# Patient Record
Sex: Male | Born: 1955 | Race: White | Hispanic: No | Marital: Married | Smoking: Never smoker
Health system: Southern US, Community
[De-identification: ages and names within clinical notes are randomized; demographics above are authoritative.]

## PROBLEM LIST (undated history)

## (undated) HISTORY — PX: FOOT FRACTURE SURGERY: SHX645

---

## 2004-09-05 ENCOUNTER — Emergency Department (HOSPITAL_COMMUNITY): Admission: EM | Admit: 2004-09-05 | Discharge: 2004-09-05 | Payer: Self-pay | Admitting: Emergency Medicine

## 2004-09-07 ENCOUNTER — Ambulatory Visit (HOSPITAL_BASED_OUTPATIENT_CLINIC_OR_DEPARTMENT_OTHER): Admission: RE | Admit: 2004-09-07 | Discharge: 2004-09-07 | Payer: Self-pay | Admitting: Orthopedic Surgery

## 2009-06-05 ENCOUNTER — Ambulatory Visit: Payer: Self-pay | Admitting: Family Medicine

## 2010-02-14 IMAGING — CR CERVICAL SPINE - 2-3 VIEW
1 series · 4 of 4 positions shown · non-contrast
Comparison: none

REASON FOR EXAM: neck pain
COMMENTS:

[Series 2: view not recorded · 0.17mm/px · 4 of 4 slices shown]
[im 1/4]
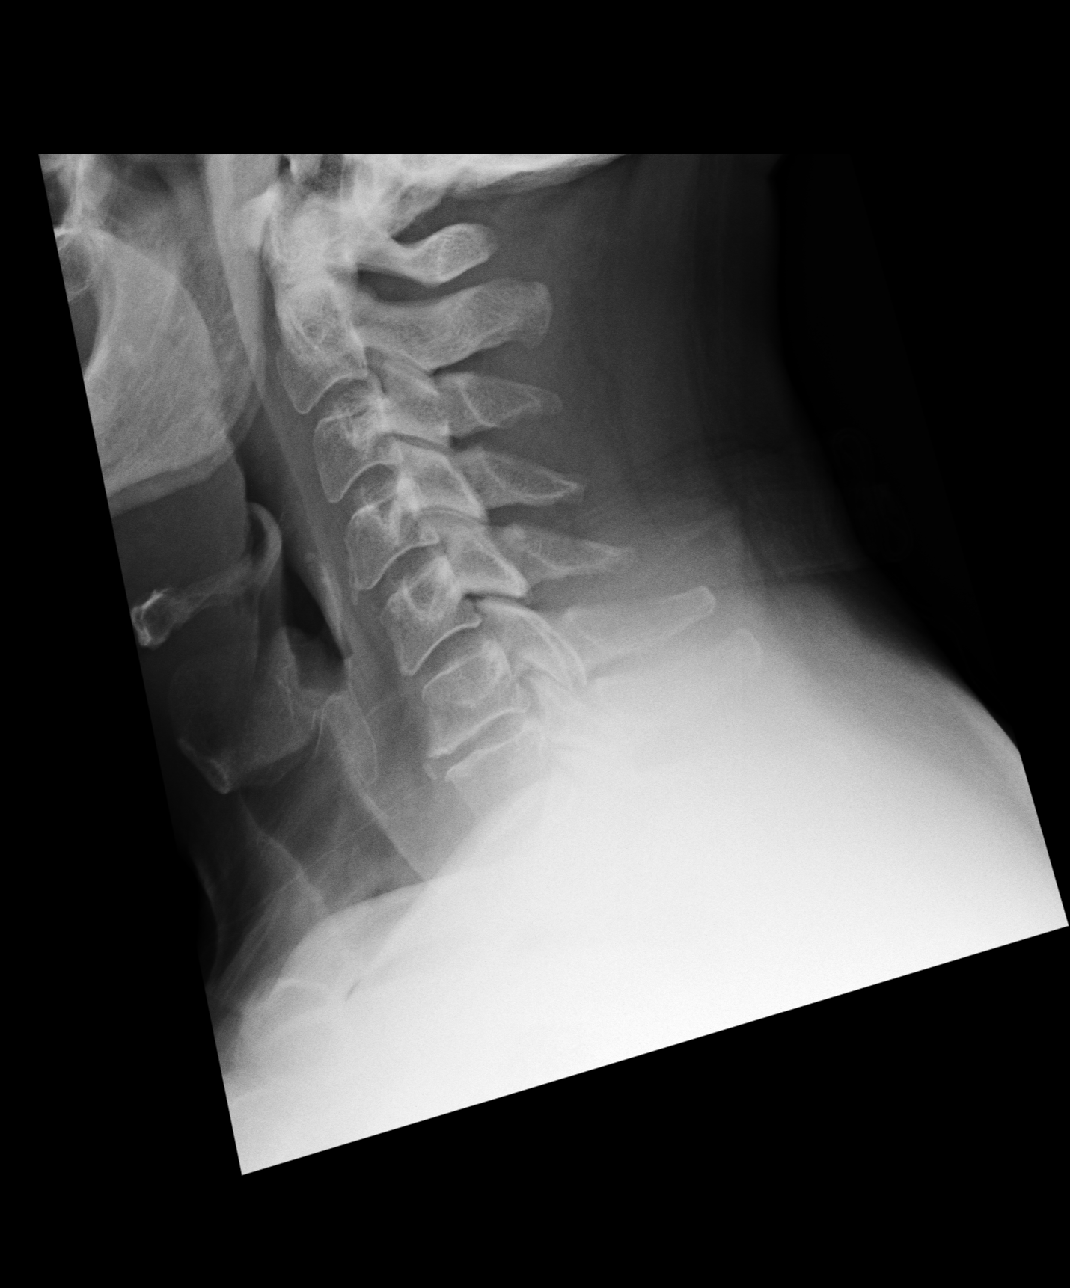
[im 2/4]
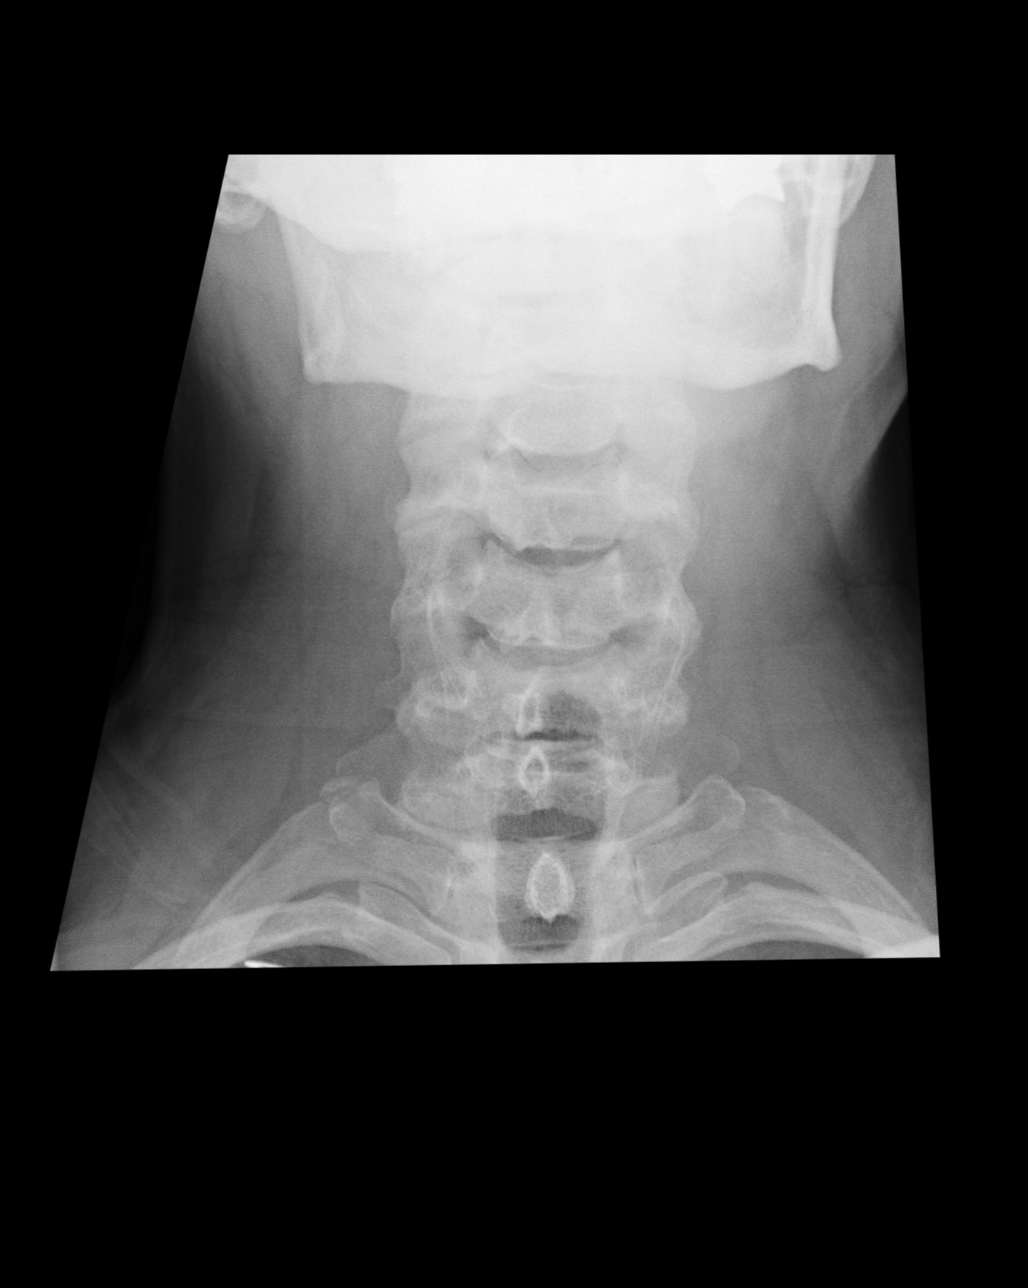
[im 3/4]
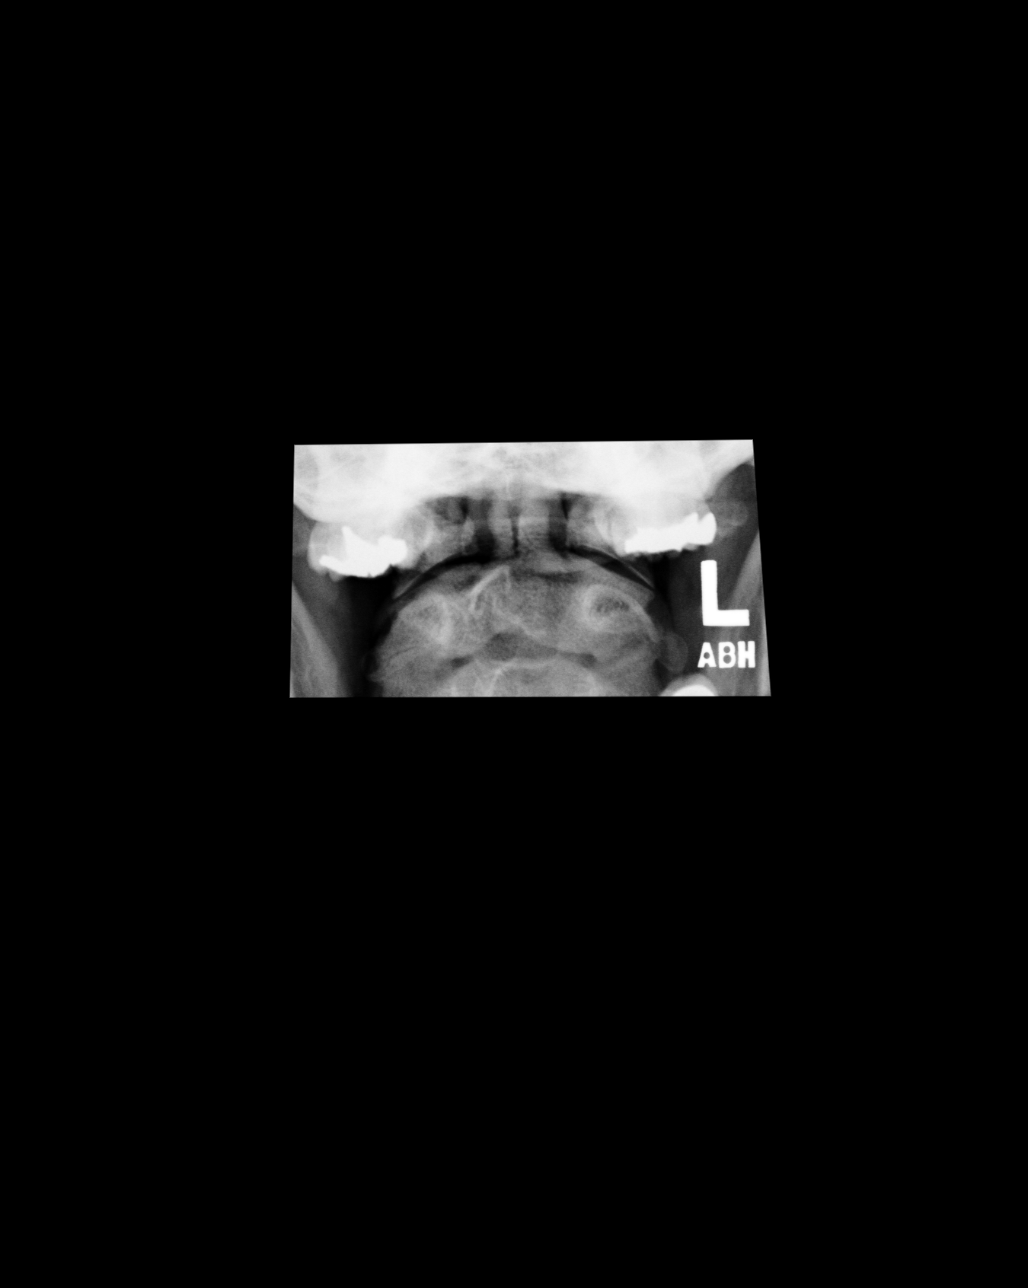
[im 4/4]
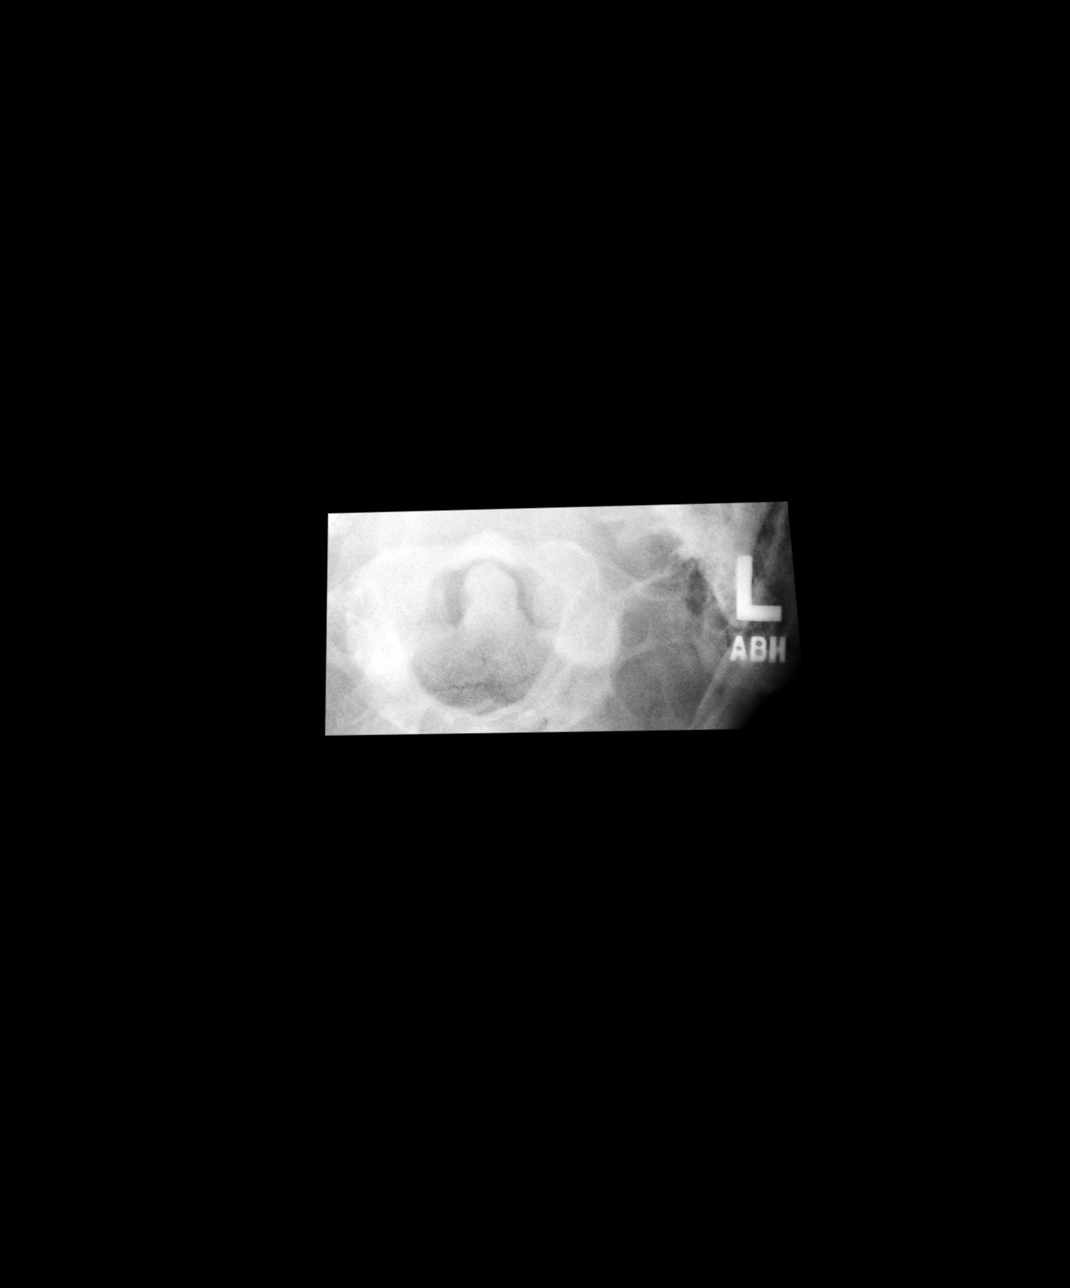

[4 of 4 positions shown; findings below may reference images not displayed]

PROCEDURE:     KDR - KDXR C-SPINE AP AND LATERAL  - June 05, 2009 [DATE]

RESULT:     The vertebral body heights are well-maintained. No fracture is
seen. There is slight narrowing of the C6-C7 cervical disc space. The change
is minimal but could represent early manifestation of disc disease. This
could be further evaluated by MR if clinically indicated. There is noted
slight anterior spur formation at the C6-C7 level. The odontoid process is
intact. No cervical rib formation is seen. In the lateral view there is
apparent straightening of the cervical spine. This finding is nonspecific
but would raise the question of cervical muscle spasm.
IMPRESSION: 1. No fracture is seen.
2. No lytic or blastic lesions are identified.
3. There is a slightly narrowed appearance to the C6-C7 cervical disc space
suspicious for early manifestation of cervical disc disease. This could be
further evaluated by MR if clinically indicated.
4. There is straightening of the cervical spine as noted above.

## 2014-07-10 ENCOUNTER — Ambulatory Visit: Payer: Self-pay | Admitting: Family Medicine

## 2014-11-28 LAB — LIPID PANEL
Cholesterol: 162 mg/dL (ref 0–200)
HDL: 26 mg/dL — AB (ref 35–70)
LDL Cholesterol: 105 mg/dL
LDl/HDL Ratio: 4
Triglycerides: 156 mg/dL (ref 40–160)

## 2014-11-28 LAB — CBC AND DIFFERENTIAL
HCT: 38 % — AB (ref 41–53)
Hemoglobin: 12.9 g/dL — AB (ref 13.5–17.5)
Neutrophils Absolute: 2 /uL
Platelets: 221 10*3/uL (ref 150–399)
WBC: 3.9 10^3/mL

## 2014-11-28 LAB — BASIC METABOLIC PANEL
BUN: 16 mg/dL (ref 4–21)
Creatinine: 0.9 mg/dL (ref 0.6–1.3)
Glucose: 99 mg/dL
Potassium: 4.2 mmol/L (ref 3.4–5.3)
Sodium: 141 mmol/L (ref 137–147)

## 2014-11-28 LAB — HEPATIC FUNCTION PANEL
ALT: 9 U/L — AB (ref 10–40)
AST: 14 U/L (ref 14–40)
Alkaline Phosphatase: 64 U/L (ref 25–125)
Bilirubin, Total: 0.3 mg/dL

## 2014-11-28 LAB — TSH: TSH: 2.58 u[IU]/mL (ref 0.41–5.90)

## 2014-11-28 LAB — PSA: PSA: 0.4

## 2015-02-19 LAB — HM COLONOSCOPY: HM Colonoscopy: NORMAL

## 2015-03-21 IMAGING — CR CERVICAL SPINE - COMPLETE 4+ VIEW
2 series · 7 of 7 positions shown · non-contrast
Comparison: 06/05/2009

CLINICAL DATA: Right neck pain without injury

EXAM:
CERVICAL SPINE  4+ VIEWS

[Series 1: kdxr c-spine complete · 0.14mm/px · 6 of 6 slices shown (1 of 2)]
[im 1/6]
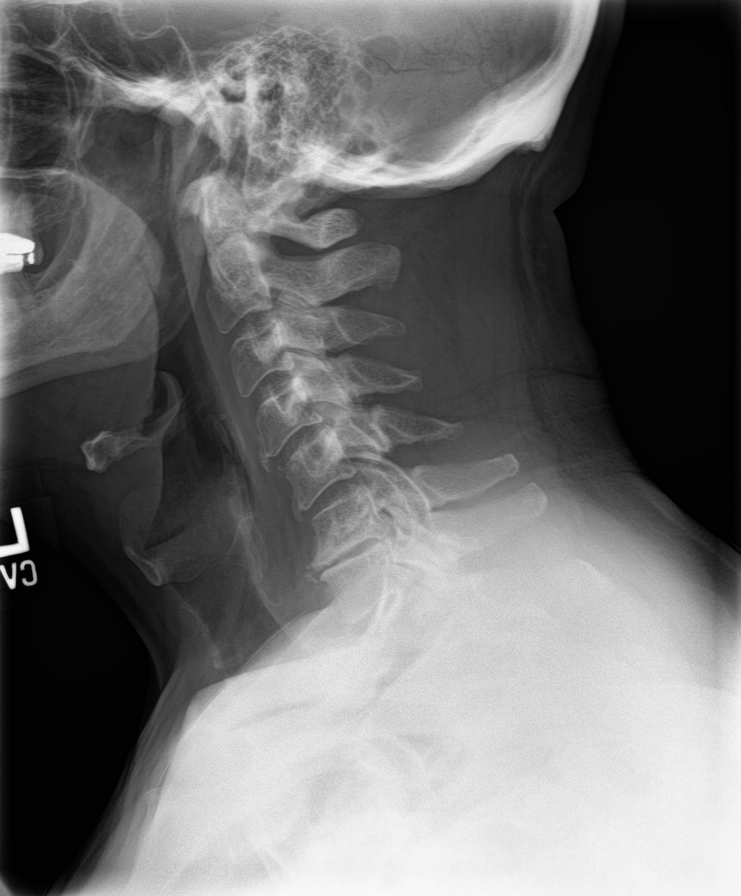
[im 2/6]
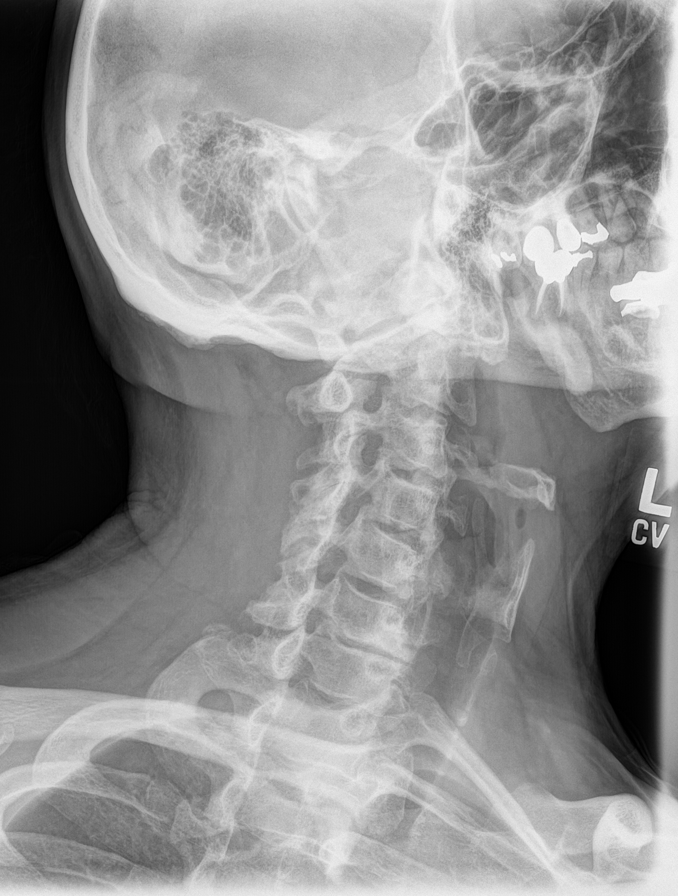
[im 3/6]
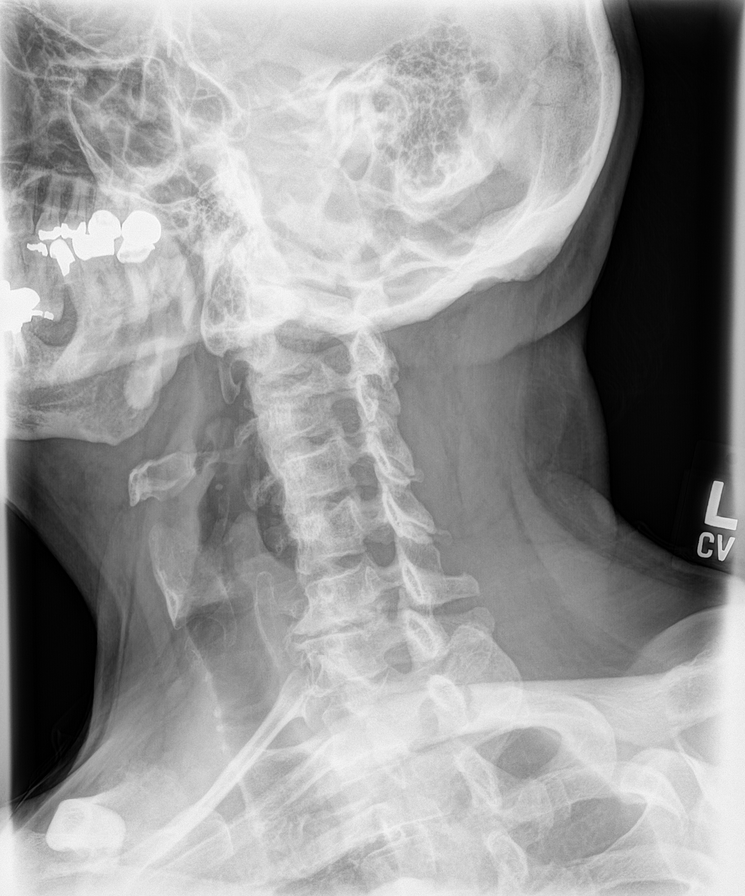
[im 4/6]
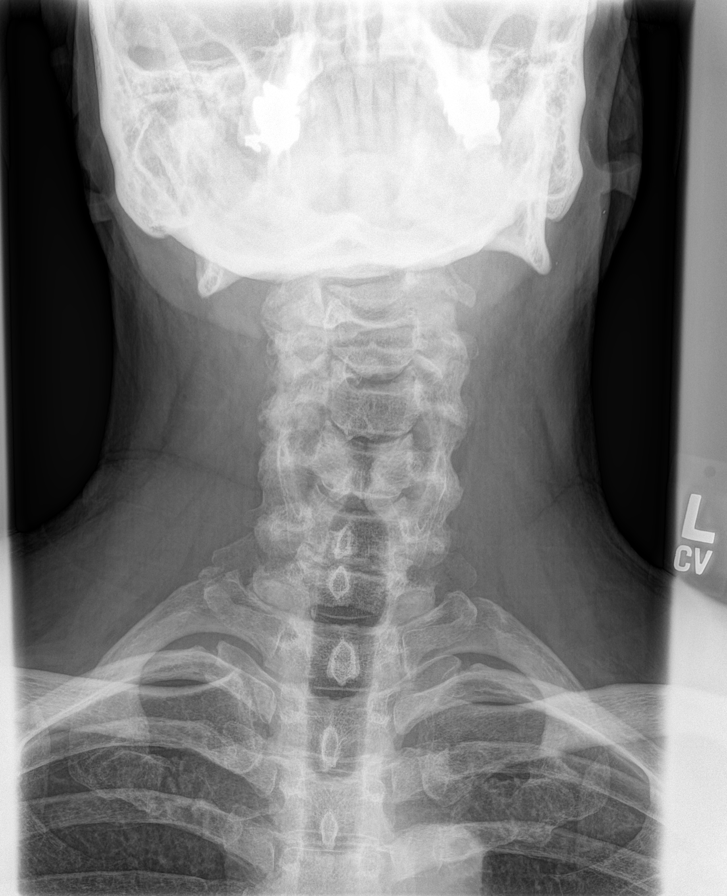
[im 5/6]
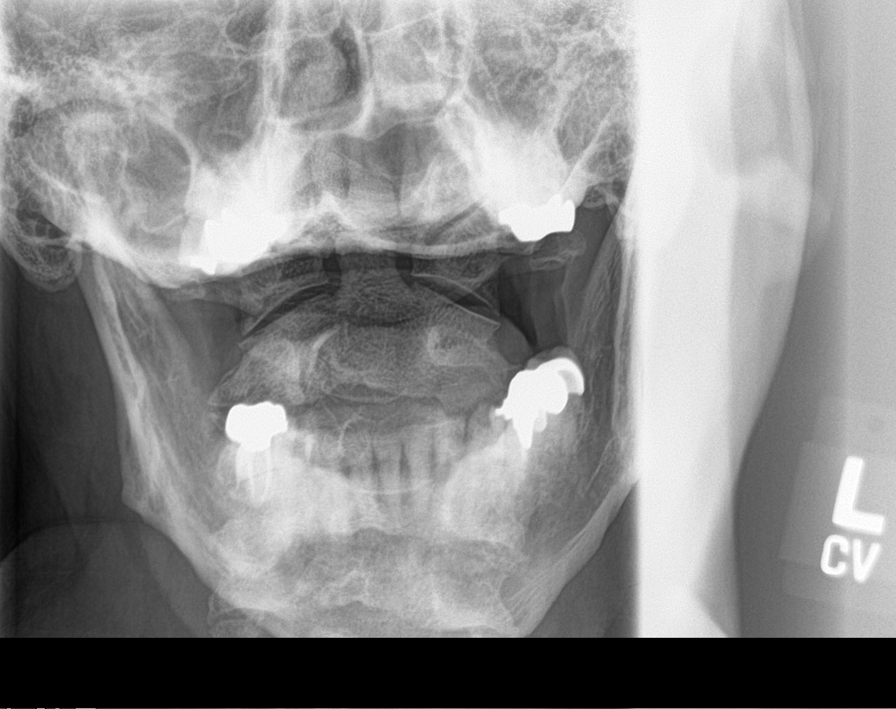
[im 6/6]
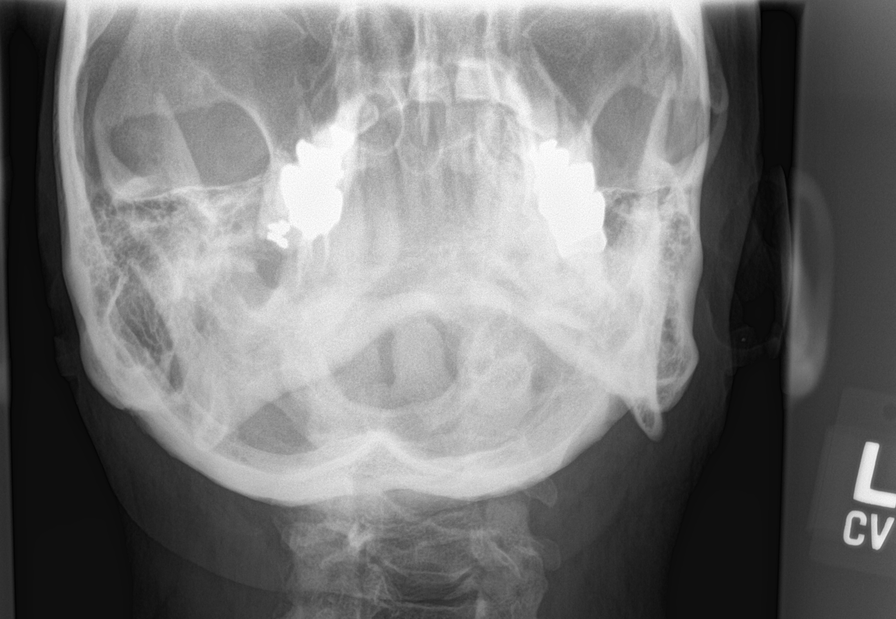

[kdxr c-spine complete (2 of 2)]
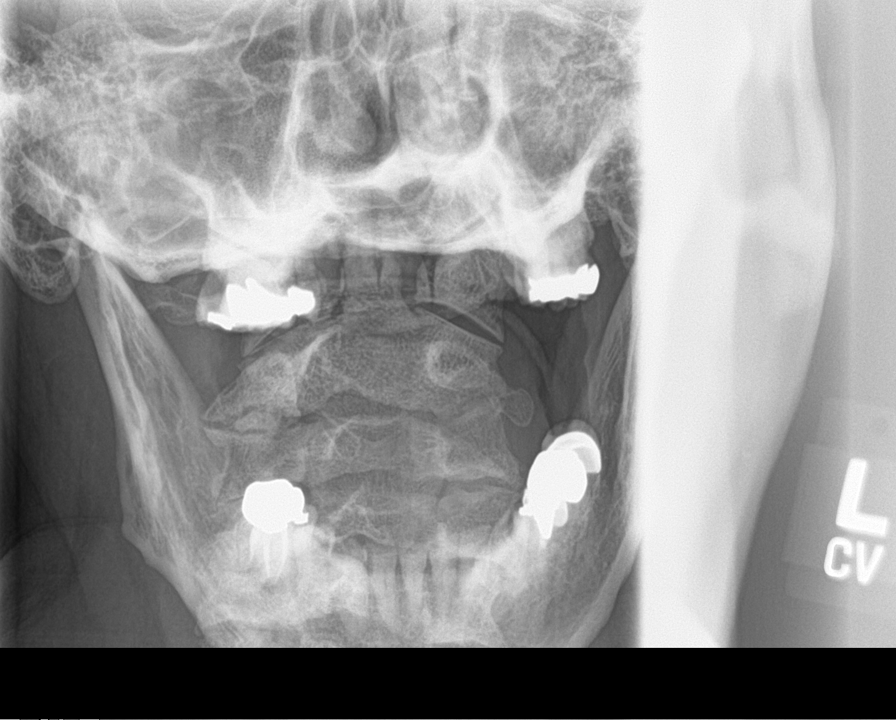

[7 of 7 positions shown; findings below may reference images not displayed]

FINDINGS: Seven cervical segments are well visualized. Disc space narrowing is
noted at C6-7 which is increased in the interval from the prior
exam. Anterior osteophytes are noted at C4-5, C5-6 and C6-7. No
significant neural foraminal narrowing is noted. No acute fracture
or acute facet abnormality is seen. The odontoid is within normal
limits.
IMPRESSION: Degenerative change without acute abnormality.

## 2015-11-04 DIAGNOSIS — G8929 Other chronic pain: Secondary | ICD-10-CM | POA: Insufficient documentation

## 2015-11-04 DIAGNOSIS — E785 Hyperlipidemia, unspecified: Secondary | ICD-10-CM | POA: Insufficient documentation

## 2015-11-04 DIAGNOSIS — M549 Dorsalgia, unspecified: Secondary | ICD-10-CM

## 2015-11-04 DIAGNOSIS — E669 Obesity, unspecified: Secondary | ICD-10-CM | POA: Insufficient documentation

## 2015-11-04 DIAGNOSIS — N529 Male erectile dysfunction, unspecified: Secondary | ICD-10-CM | POA: Insufficient documentation

## 2015-12-02 ENCOUNTER — Ambulatory Visit (INDEPENDENT_AMBULATORY_CARE_PROVIDER_SITE_OTHER): Payer: 59 | Admitting: Family Medicine

## 2015-12-02 ENCOUNTER — Encounter: Payer: Self-pay | Admitting: Family Medicine

## 2015-12-02 VITALS — BP 160/98 | HR 68 | Temp 98.6°F | Resp 16 | Ht 70.0 in | Wt 234.0 lb

## 2015-12-02 DIAGNOSIS — Z0000 Encounter for general adult medical examination without abnormal findings: Secondary | ICD-10-CM

## 2015-12-02 DIAGNOSIS — M722 Plantar fascial fibromatosis: Secondary | ICD-10-CM | POA: Diagnosis not present

## 2015-12-02 DIAGNOSIS — Z125 Encounter for screening for malignant neoplasm of prostate: Secondary | ICD-10-CM

## 2015-12-02 LAB — POCT URINALYSIS DIPSTICK
Bilirubin, UA: NEGATIVE
Blood, UA: NEGATIVE
Glucose, UA: NEGATIVE
Ketones, UA: NEGATIVE
Leukocytes, UA: NEGATIVE
Nitrite, UA: NEGATIVE
Protein, UA: NEGATIVE
Spec Grav, UA: 1.02
Urobilinogen, UA: NEGATIVE
pH, UA: 5

## 2015-12-02 MED ORDER — NAPROXEN 500 MG PO TABS: 500.0000 mg | ORAL_TABLET | Freq: Two times a day (BID) | ORAL | Status: DC

## 2015-12-02 NOTE — Progress Notes (Signed)
Patient ID: Travis Brown, male   DOB: 1956/06/24, 60 y.o.   MRN: [id number] Patient: Travis Brown, Male    DOB: [birth year], 60 y.o.   MRN: [id number] Visit Date: 12/02/2015  Today's Provider: Megan Mans, MD   Chief Complaint  Patient presents with  . Annual Exam   Subjective:  Travis Brown is a 60 y.o. male who presents today for health maintenance and complete physical. He feels well. He reports exercising none. He reports he is sleeping well.   Review of Systems  Constitutional: Negative.   HENT: Positive for tinnitus.   Eyes: Negative.   Respiratory: Negative.   Cardiovascular: Negative.   Gastrointestinal: Positive for anal bleeding.  Endocrine: Negative.   Genitourinary: Negative.   Musculoskeletal: Negative.   Skin: Negative.   Allergic/Immunologic: Negative.   Neurological: Negative.   Hematological: Negative.   Psychiatric/Behavioral: Negative.     Social History   Social History  . Marital Status: Married    Spouse Name: N/A  . Number of Children: N/A  . Years of Education: N/A   Occupational History  . Not on file.   Social History Main Topics  . Smoking status: Never Smoker   . Smokeless tobacco: Not on file  . Alcohol Use: 0.0 oz/week    0 Standard drinks or equivalent per week     Comment: 4 per week  . Drug Use: No  . Sexual Activity: Not on file   Other Topics Concern  . Not on file   Social History Narrative    Patient Active Problem List   Diagnosis Date Noted  . Back pain, chronic 11/04/2015  . 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 HLD (hyperlipidemia) 11/04/2015  . Adiposity 11/04/2015    Past Surgical History  Procedure Laterality Date  . Foot fracture surgery      pins put in    His family history includes Dementia in his mother; Diabetes in his father; Heart disease in his brother and father; Hypertension in his brother and father; Kidney failure in his father.    Outpatient Prescriptions  Prior to Visit  Medication Sig Dispense Refill  . naproxen (NAPROSYN) 500 MG tablet Take by mouth.     No facility-administered medications prior to visit.   Outpatient Encounter Prescriptions as of 12/02/2015  Medication Sig Note  . [DISCONTINUED] naproxen (NAPROSYN) 500 MG tablet Take by mouth. 11/04/2015: Received from: [facility]   No facility-administered encounter medications on file as of 12/02/2015.    Patient Care Team: Maple Hudson., MD as PCP - General (Family Medicine)     Objective:   Vitals:  Filed Vitals:   12/02/15 0827  BP: 160/98  Pulse: 68  Temp: 98.6 F (37 C)  TempSrc: Oral  Resp: 16  Height:  (1.778 m)  Weight: 234 lb (106.142 kg)    Physical Exam  Constitutional: He is oriented to person, place, and time. He appears well-developed and well-nourished.  HENT:  Head: Normocephalic and atraumatic.  Right Ear: External ear normal.  Left Ear: External ear normal.  Nose: Nose normal.  Mouth/Throat: Oropharynx is clear and moist.  Eyes: Conjunctivae and EOM are normal. Pupils are equal, round, and reactive to light.  Neck: Normal range of motion. Neck supple.  Cardiovascular: Normal rate, regular rhythm, normal heart sounds and intact distal pulses.   Pulmonary/Chest: Effort normal and breath sounds normal.  Abdominal: Soft. Bowel sounds are normal.  Genitourinary: Penis normal.  Musculoskeletal: Normal range of motion.  Neurological: He is alert and oriented to person, place, and time.  Skin: Skin is warm and dry.  Psychiatric: He has a normal mood and affect. His behavior is normal. Judgment and thought content normal.     Depression Screen No flowsheet data found.    Assessment & Plan:    1. Annual physical exam  - CBC With Differential/Platelet - COMPLETE METABOLIC PANEL WITH GFR - Lipid Panel With LDL/HDL Ratio - TSH  2. Prostate cancer screening  - PSA   Exercise Activities and Dietary  recommendations Goals    None      Immunization History  Administered Date(s) Administered  . Tdap 07/24/2008    Health Maintenance  Topic Date Due  . Hepatitis C Screening  03-Oct-1956  . HIV Screening  04/12/1970  . ZOSTAVAX  04/13/2015  . INFLUENZA VACCINE  06/09/2015  . TETANUS/TDAP  07/24/2018  . COLONOSCOPY  02/18/2025      Discussed health benefits of physical activity, and encouraged him to engage in regular exercise appropriate for his age and condition.   right plPlantar fasciitis Treatment  with naproxen twice a day for a few weeks then refer to podiatry if it  is not improved. ------------------------------------------------------------------------------------------------------------

## 2015-12-03 ENCOUNTER — Telehealth: Payer: Self-pay

## 2015-12-03 LAB — COMPREHENSIVE METABOLIC PANEL
ALT: 9 IU/L (ref 0–44)
AST: 15 IU/L (ref 0–40)
Albumin/Globulin Ratio: 1.6 (ref 1.1–2.5)
Albumin: 4.4 g/dL (ref 3.6–4.8)
Alkaline Phosphatase: 68 IU/L (ref 39–117)
BUN/Creatinine Ratio: 17 (ref 10–22)
BUN: 15 mg/dL (ref 8–27)
Bilirubin Total: 0.3 mg/dL (ref 0.0–1.2)
CO2: 25 mmol/L (ref 18–29)
Calcium: 9 mg/dL (ref 8.6–10.2)
Chloride: 102 mmol/L (ref 96–106)
Creatinine, Ser: 0.87 mg/dL (ref 0.76–1.27)
GFR calc Af Amer: 108 mL/min/{1.73_m2} (ref >59)
GFR calc non Af Amer: 94 mL/min/{1.73_m2} (ref >59)
Globulin, Total: 2.7 g/dL (ref 1.5–4.5)
Glucose: 108 mg/dL — ABNORMAL HIGH (ref 65–99)
Potassium: 4.5 mmol/L (ref 3.5–5.2)
Sodium: 143 mmol/L (ref 134–144)
Total Protein: 7.1 g/dL (ref 6.0–8.5)

## 2015-12-03 LAB — CBC WITH DIFFERENTIAL/PLATELET
Basophils Absolute: 0 10*3/uL (ref 0.0–0.2)
Basos: 0 %
EOS (ABSOLUTE): 0.1 10*3/uL (ref 0.0–0.4)
Eos: 2 %
Hematocrit: 38.3 % (ref 37.5–51.0)
Hemoglobin: 12.8 g/dL (ref 12.6–17.7)
Immature Grans (Abs): 0 10*3/uL (ref 0.0–0.1)
Immature Granulocytes: 0 %
Lymphocytes Absolute: 1.6 10*3/uL (ref 0.7–3.1)
Lymphs: 30 %
MCH: 26.5 pg — ABNORMAL LOW (ref 26.6–33.0)
MCHC: 33.4 g/dL (ref 31.5–35.7)
MCV: 79 fL (ref 79–97)
Monocytes Absolute: 0.4 10*3/uL (ref 0.1–0.9)
Monocytes: 7 %
Neutrophils Absolute: 3.3 10*3/uL (ref 1.4–7.0)
Neutrophils: 61 %
Platelets: 206 10*3/uL (ref 150–379)
RBC: 4.83 x10E6/uL (ref 4.14–5.80)
RDW: 14.3 % (ref 12.3–15.4)
WBC: 5.4 10*3/uL (ref 3.4–10.8)

## 2015-12-03 LAB — LIPID PANEL WITH LDL/HDL RATIO
Cholesterol, Total: 180 mg/dL (ref 100–199)
HDL: 30 mg/dL — ABNORMAL LOW (ref >39)
LDL Calculated: 119 mg/dL — ABNORMAL HIGH (ref 0–99)
LDl/HDL Ratio: 4 ratio units — ABNORMAL HIGH (ref 0.0–3.6)
Triglycerides: 153 mg/dL — ABNORMAL HIGH (ref 0–149)
VLDL Cholesterol Cal: 31 mg/dL (ref 5–40)

## 2015-12-03 LAB — PSA: Prostate Specific Ag, Serum: 0.3 ng/mL (ref 0.0–4.0)

## 2015-12-03 LAB — TSH: TSH: 3.27 u[IU]/mL (ref 0.450–4.500)

## 2015-12-03 NOTE — Telephone Encounter (Signed)
-----   Message from Travis Brown., MD sent at 12/03/2015 11:02 AM EST ----- Labs okay. Patient mildly prediabetic. Diet and exercise suggested.

## 2015-12-03 NOTE — Telephone Encounter (Signed)
LMTCB 12/03/2015  Thanks,   -Laura  

## 2015-12-03 NOTE — Telephone Encounter (Signed)
Pt advised.   Thanks,   -Laura  

## 2016-03-04 ENCOUNTER — Encounter: Payer: Self-pay | Admitting: Family Medicine

## 2016-03-04 ENCOUNTER — Ambulatory Visit (INDEPENDENT_AMBULATORY_CARE_PROVIDER_SITE_OTHER): Payer: 59 | Admitting: Family Medicine

## 2016-03-04 VITALS — BP 142/90 | HR 62 | Temp 98.8°F | Resp 12 | Wt 225.0 lb

## 2016-03-04 DIAGNOSIS — IMO0001 Reserved for inherently not codable concepts without codable children: Secondary | ICD-10-CM

## 2016-03-04 DIAGNOSIS — R030 Elevated blood-pressure reading, without diagnosis of hypertension: Secondary | ICD-10-CM | POA: Diagnosis not present

## 2016-03-04 DIAGNOSIS — R7309 Other abnormal glucose: Secondary | ICD-10-CM | POA: Diagnosis not present

## 2016-03-04 DIAGNOSIS — E669 Obesity, unspecified: Secondary | ICD-10-CM

## 2016-03-04 DIAGNOSIS — M722 Plantar fascial fibromatosis: Secondary | ICD-10-CM | POA: Diagnosis not present

## 2016-03-04 LAB — POCT GLYCOSYLATED HEMOGLOBIN (HGB A1C): Hemoglobin A1C: 5.4

## 2016-03-04 NOTE — Progress Notes (Signed)
Patient ID: Travis Brown, male   DOB: Mar 23, 1956, 60 y.o.   MRN: [id number]    Subjective:  HPI  Patient is here for 3 months follow up.  B/P was elevated last time per patient more than usual and we were going to follow up on that. Patient is not checking his b/p at home. BP Readings from Last 3 Encounters:  03/04/16 142/90  12/02/15 160/98  11/28/14 138/88   Hyperglycemia: Patient had routine labs done in January and glucose was 108-fasting. Patient does not check his sugar at home. He has been working on his habits and lost 9 lbs since his last visit. Wt Readings from Last 3 Encounters:  03/04/16 225 lb (102.059 kg)  12/02/15 234 lb (106.142 kg)  11/28/14 231 lb (104.781 kg)    Plantar fascitis: patient was started on Naproxen and he took it for about 2 weeks and has stopped, symptoms have improved.  Prior to Admission medications   Medication Sig Start Date End Date Taking? Authorizing Provider  naproxen (NAPROSYN) 500 MG tablet Take 1 tablet (500 mg total) by mouth 2 (two) times daily with a meal. 12/02/15   Maple Hudson., MD  naproxen (NAPROSYN) 500 MG tablet Take 1 tablet (500 mg total) by mouth 2 (two) times daily with a meal. 12/02/15   Maple Hudson., MD    Patient Active Problem List   Diagnosis Date Noted  . Back pain, chronic 11/04/2015  . 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 HLD (hyperlipidemia) 11/04/2015  . Adiposity 11/04/2015    No past medical history on file.  Social History   Social History  . Marital Status: Married    Spouse Name: N/A  . Number of Children: N/A  . Years of Education: N/A   Occupational History  . Not on file.   Social History Main Topics  . Smoking status: Never Smoker   . Smokeless tobacco: Never Used  . Alcohol Use: 0.0 oz/week    0 Standard drinks or equivalent per week     Comment: 4 per week  . Drug Use: No  . Sexual Activity: Not on file   Other Topics Concern  . Not on file     Social History Narrative    No Known Allergies  Review of Systems  Constitutional: Negative.   Respiratory: Negative.   Cardiovascular: Negative.   Gastrointestinal: Negative.   Musculoskeletal: Negative.     Immunization History  Administered Date(s) Administered  . Tdap 07/24/2008   Objective:  BP 142/90 mmHg  Pulse 62  Temp(Src) 98.8 F (37.1 C)  Resp 12  Wt 225 lb (102.059 kg)  Physical Exam  Constitutional: He is oriented to person, place, and time and well-developed, well-nourished, and in no distress.  HENT:  Head: Normocephalic and atraumatic.  Right Ear: External ear normal.  Left Ear: External ear normal.  Eyes: Conjunctivae are normal. Pupils are equal, round, and reactive to light.  Neck: Normal range of motion. Neck supple.  Cardiovascular: Normal rate, regular rhythm, normal heart sounds and intact distal pulses.   No murmur heard. Pulmonary/Chest: Effort normal and breath sounds normal. No respiratory distress. He has no wheezes.  Musculoskeletal: He exhibits no edema or tenderness.  Neurological: He is alert and oriented to person, place, and time.  Psychiatric: Mood, memory, affect and judgment normal.    Lab Results  Component Value Date   WBC 5.4 12/02/2015   HGB 12.9* 11/28/2014   HCT 38.3  12/02/2015   PLT 206 12/02/2015   GLUCOSE 108* 12/02/2015   CHOL 180 12/02/2015   TRIG 153* 12/02/2015   HDL 30* 12/02/2015   LDLCALC 119* 12/02/2015   TSH 3.270 12/02/2015   PSA 0.4 11/28/2014    CMP     Component Value Date/Time   NA 143 12/02/2015 0942   K 4.5 12/02/2015 0942   CL 102 12/02/2015 0942   CO2 25 12/02/2015 0942   GLUCOSE 108* 12/02/2015 0942   BUN 15 12/02/2015 0942   CREATININE 0.87 12/02/2015 0942   CREATININE 0.9 11/28/2014   CALCIUM 9.0 12/02/2015 0942   PROT 7.1 12/02/2015 0942   ALBUMIN 4.4 12/02/2015 0942   AST 15 12/02/2015 0942   ALT 9 12/02/2015 0942   ALKPHOS 68 12/02/2015 0942   BILITOT 0.3 12/02/2015 0942    GFRNONAA 94 12/02/2015 0942   GFRAA 108 12/02/2015 0942    Assessment and Plan :  1. Elevated blood pressure Better today, still borderline. Continue working on habits. Follow. May need to add medication in the future but not today.  2. Elevated glucose level A1C 5.4 today. Good. Patient has worked on his habits. Follow. - POCT HgB A1C  3. Plantar fascia syndrome Improved. Follow as needed.  4. Adiposity Continue working on habits, discussed this in details. Control and weight is stressed as the key to controlling the above problems. Medications will be used if necessary. I am very pleased that he has lost 9 pounds since his last visit. Return to clinic in 6 months or sooner as needed. Patient was seen and examined by Dr. Bosie Closichard L Gilbert and note was scribed by Samara DeistAnastasiya Aleksandrova, RMA.  Julieanne Mansonichard Gilbert MD [facility] 03/04/2016 8:20 AM

## 2016-06-10 HISTORY — PX: MOUTH SURGERY: SHX715

## 2016-06-16 ENCOUNTER — Encounter: Payer: Self-pay | Admitting: Family Medicine

## 2016-06-16 ENCOUNTER — Ambulatory Visit (INDEPENDENT_AMBULATORY_CARE_PROVIDER_SITE_OTHER): Payer: 59 | Admitting: Family Medicine

## 2016-06-16 VITALS — BP 136/86 | HR 72 | Temp 97.7°F | Resp 16 | Ht 70.0 in | Wt 235.0 lb

## 2016-06-16 DIAGNOSIS — G510 Bell's palsy: Secondary | ICD-10-CM

## 2016-06-16 MED ORDER — VALACYCLOVIR HCL 1 G PO TABS: 1000.0000 mg | ORAL_TABLET | Freq: Three times a day (TID) | ORAL | 0 refills | Status: AC

## 2016-06-16 MED ORDER — PREDNISONE 10 MG PO TABS: ORAL_TABLET | ORAL | 0 refills | Status: AC

## 2016-06-16 NOTE — Patient Instructions (Signed)
Bell Palsy °Bell palsy is a condition in which the muscles on one side of the face become paralyzed. This often causes one side of the face to droop. It is a common condition and most people recover completely. °RISK FACTORS °Risk factors for Bell palsy include: °· Pregnancy. °· Diabetes. °· An infection by a virus, such as infections that cause cold sores. °CAUSES  °Bell palsy is caused by damage to or inflammation of a nerve in your face. It is unclear why this happens, but an infection by a virus may lead to it. Most of the time the reason it happens is unknown. °SIGNS AND SYMPTOMS  °Symptoms can range from mild to severe and can take place over a number of hours. Symptoms may include: °· Being unable to: °¨ Raise one or both eyebrows. °¨ Close one or both eyes. °¨ Feel parts of your face (facial numbness). °· Drooping of the eyelid and corner of the mouth. °· Weakness in the face. °· Paralysis of half your face. °· Loss of taste. °· Sensitivity to loud noises. °· Difficulty chewing. °· Tearing up of the affected eye. °· Dryness in the affected eye. °· Drooling. °· Pain behind one ear. °DIAGNOSIS  °Diagnosis of Bell palsy may include: °· A medical history and physical exam. °· An MRI. °· A CT scan. °· Electromyography (EMG). This is a test that checks how your nerves are working. °TREATMENT  °Treatment may include antiviral medicine to help shorten the length of the condition. Sometimes treatment is not needed and the symptoms go away on their own. °HOME CARE INSTRUCTIONS  °· Take medicines only as directed by your health care provider. °· Do facial massages and exercises as directed by your health care provider. °· If your eye is affected: °¨ Use moisturizing eye drops to prevent drying of your eye as directed by your health care provider. °¨ Protect your eye as directed by your health care provider. °SEEK MEDICAL CARE IF: °· Your symptoms do not get better or get worse. °· You are drooling. °· Your eye is red,  irritated, or hurts. °SEEK IMMEDIATE MEDICAL CARE IF:  °· Another part of your body feels weak or numb. °· You have difficulty swallowing. °· You have a fever along with symptoms of Bell palsy. °· You develop neck pain. °MAKE SURE YOU:  °· Understand these instructions. °· Will watch your condition. °· Will get help right away if you are not doing well or get worse. °  °This information is not intended to replace advice given to you by your health care provider. Make sure you discuss any questions you have with your health care provider. °  °Document Released: 10/25/2005 Document Revised: 07/16/2015 Document Reviewed: 02/01/2014 °Elsevier Interactive Patient Education ©2016 Else[employer] Inc[employer] ° °

## 2016-06-16 NOTE — Progress Notes (Signed)
       Patient: Travis Brown Male    DOB: 03/20/1956   60 y.o.   MRN: [id number] Visit Date: 06/16/2016  Today's Provider: Mila Merryonald Lakeesha Fontanilla, MD   Chief Complaint  Patient presents with  . Facial Droop   Subjective:    HPI Patient reports gradual onset of right side facial drooping last night. Patient reports tongue feels numb, patient reports that he had dental work on 06/10/2016 and is currently taking Amoxicillin TID for dental infection. Patient denies problems swallowing, speaking, numbness or weakness in extremities. Otherwise he feels perfectly well.     No Known Allergies Current Meds  Medication Sig  . amoxicillin (AMOXIL) 500 MG capsule Take 1 capsule by mouth 3 (three) times daily.  . Hydrocodone-Acetaminophen 5-300 MG TABS Take 1 tablet by mouth as needed.    Review of Systems  Constitutional: Negative.   Neurological: Positive for facial asymmetry.    Social History  Substance Use Topics  . Smoking status: Never Smoker  . Smokeless tobacco: Never Used  . Alcohol use 0.0 oz/week     Comment: 4 per week   Objective:   BP 136/86 (BP Location: Left Arm, Patient Position: Sitting, Cuff Size: Large)   Pulse 72   Temp 97.7 F (36.5 C) (Oral)   Resp 16   Ht 5\' 10"  (1.778 m)   Wt 235 lb (106.6 kg)   SpO2 97%   BMI 33.72 kg/m   Physical Exam   General Appearance:    Alert, cooperative, no distress  Eyes:    PERRL, conjunctiva/corneas clear, EOM's intact       Lungs:     Clear to auscultation bilaterally, respirations unlabored  Heart:    Regular rate and rhythm  Neurologic:   Awake, alert, oriented x 3. Absence of voluntary movements of entire right side of face. Absence of forehead wrinkles on right. Normal speech. Normal thought processes. Normal strength and tone of extremities.           Assessment & Plan:     1. Right-sided Bell's palsy  - valACYclovir (VALTREX) 1000 MG tablet; Take 1 tablet (1,000 mg total) by mouth every 8 (eight) hours.   Dispense: 21 tablet; Refill: 0 - predniSONE (DELTASONE) 10 MG tablet; 6 tablets for 1 day, then 5 for 1 day, then 4 for 1 day, then 3 for 1 day, then 2 for 1 day then 1 for 1 day.  Dispense: 21 tablet; Refill: 0       Mila Merryonald Megin Consalvo, MD  [facility]

## 2016-08-30 ENCOUNTER — Encounter: Payer: Self-pay | Admitting: Family Medicine

## 2016-08-30 ENCOUNTER — Ambulatory Visit (INDEPENDENT_AMBULATORY_CARE_PROVIDER_SITE_OTHER): Payer: 59 | Admitting: Family Medicine

## 2016-08-30 VITALS — BP 162/94 | HR 62 | Temp 98.6°F | Resp 12 | Wt 234.0 lb

## 2016-08-30 DIAGNOSIS — Z23 Encounter for immunization: Secondary | ICD-10-CM

## 2016-08-30 DIAGNOSIS — I10 Essential (primary) hypertension: Secondary | ICD-10-CM

## 2016-08-30 DIAGNOSIS — E7849 Other hyperlipidemia: Secondary | ICD-10-CM

## 2016-08-30 DIAGNOSIS — Z6833 Body mass index (BMI) 33.0-33.9, adult: Secondary | ICD-10-CM | POA: Diagnosis not present

## 2016-08-30 DIAGNOSIS — E784 Other hyperlipidemia: Secondary | ICD-10-CM | POA: Diagnosis not present

## 2016-08-30 MED ORDER — LISINOPRIL 10 MG PO TABS: 10.0000 mg | ORAL_TABLET | Freq: Every day | ORAL | 5 refills | Status: DC

## 2016-08-30 NOTE — Progress Notes (Signed)
Travis Brown  MRN: [id number] DOB: 08-23-1956  Subjective:  HPI  Patient is here for follow up on blood pressure. In April his b/p was better and we were going to just follow it. Patient checks his b/p sometimes and the last 2 readings were elevated. He is trying to work on increasing exercising. No cardiac symptoms present. Pt snores some but no [place] apnea per pt. Last labs were routine in January 2017. BP Readings from Last 3 Encounters:  08/30/16 (!) 162/94  06/16/16 136/86  03/04/16 (!) 142/90   Patient Active Problem List   Diagnosis Date Noted  . Back pain, chronic 11/04/2015  . 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 HLD (hyperlipidemia) 11/04/2015  . Adiposity 11/04/2015    No past medical history on file.  Social History   Social History  . Marital status: Married    Spouse name: N/A  . Number of children: N/A  . Years of education: N/A   Occupational History  . Not on file.   Social History Main Topics  . Smoking status: Never Smoker  . Smokeless tobacco: Never Used  . Alcohol use 0.0 oz/week     Comment: 4 per week  . Drug use: No  . Sexual activity: Not on file   Other Topics Concern  . Not on file   Social History Narrative  . No narrative on file    Outpatient Encounter Prescriptions as of 08/30/2016  Medication Sig Note  . [DISCONTINUED] amoxicillin (AMOXIL) 500 MG capsule Take 1 capsule by mouth 3 (three) times daily. 06/16/2016: Received from: External Pharmacy  . [DISCONTINUED] Hydrocodone-Acetaminophen 5-300 MG TABS Take 1 tablet by mouth as needed. 06/16/2016: Received from: External Pharmacy   No facility-administered encounter medications on file as of 08/30/2016.     No Known Allergies  Review of Systems  Constitutional: Negative.   Respiratory: Negative.   Cardiovascular: Negative.   Gastrointestinal: Negative.   Musculoskeletal: Negative.        Some cramping off and on on the right side of the neck.    Neurological:       Residual very mild right facial weakness from recent Bell's palsy.  Psychiatric/Behavioral: Negative.    Objective:  BP (!) 162/94   Pulse 62   Temp 98.6 F (37 C)   Resp 12   Wt 234 lb (106.1 kg)   BMI 33.58 kg/m   Physical Exam  Constitutional: He is oriented to person, place, and time and well-developed, well-nourished, and in no distress.  HENT:  Head: Normocephalic and atraumatic.  Eyes: Conjunctivae are normal. Pupils are equal, round, and reactive to light.  Neck: Normal range of motion. Neck supple.  Cardiovascular: Normal rate, regular rhythm, normal heart sounds and intact distal pulses.   No murmur heard. Pulmonary/Chest: Effort normal and breath sounds normal. No respiratory distress. He has no wheezes.  Musculoskeletal: Normal range of motion. He exhibits no edema or tenderness.  Neurological: He is alert and oriented to person, place, and time. A cranial nerve deficit is present. Gait normal.  There is a mild right facial weakness with smile.  Skin: Skin is warm and dry.  Psychiatric: Mood, memory, affect and judgment normal.    Assessment and Plan :  1. Essential hypertension Elevated. Start Lisinopril 10mg . Advised of possible side effect from this medication.  2. BMI 33.0-33.9,adult Work on habits.  3. Other hyperlipidemia   HPI, Exam and A&P transcribed under direction and in the presence of  Julieanne Mansonichard Gilbert, MD. I have done the exam and reviewed the chart and it is accurate to the best of my knowledge. Julieanne Mansonichard Gilbert M.D. [facility]

## 2016-10-20 ENCOUNTER — Ambulatory Visit (INDEPENDENT_AMBULATORY_CARE_PROVIDER_SITE_OTHER): Payer: 59 | Admitting: Family Medicine

## 2016-10-20 ENCOUNTER — Encounter: Payer: Self-pay | Admitting: Family Medicine

## 2016-10-20 VITALS — BP 142/92 | HR 60 | Temp 98.4°F | Resp 14 | Wt 232.4 lb

## 2016-10-20 DIAGNOSIS — I10 Essential (primary) hypertension: Secondary | ICD-10-CM | POA: Diagnosis not present

## 2016-10-20 MED ORDER — LISINOPRIL 40 MG PO TABS: 40.0000 mg | ORAL_TABLET | Freq: Every day | ORAL | 11 refills | Status: DC

## 2016-10-20 NOTE — Progress Notes (Signed)
Patient: Travis ProRobert K Brown Male    DOB: 06/14/1956   60 y.o.   MRN: [id number] Visit Date: 10/20/2016  Today's Provider: Megan Mansichard Gilbert Jr, MD   Chief Complaint  Patient presents with  . Hypertension  . Follow-up   Subjective:    HPI  Hypertension, follow-up:  BP Readings from Last 3 Encounters:  10/20/16 (!) 142/92  08/30/16 (!) 162/94  06/16/16 136/86    He was last seen for hypertension 2 months ago.  BP at that visit was 162/94. Management changes since that visit include started Lisinopril 10 mg on 08/30/2016. He reports excellent compliance with treatment. He is not having side effects.  He is exercising. He is adherent to low salt diet.   Outside blood pressures are not being checked at home, but when he gives blood they check. He is experiencing none.  Patient denies chest pain, irregular heart beat and palpitations.   Cardiovascular risk factors include advanced age (older than 4155 for men, 965 for women), dyslipidemia, hypertension, male gender and obesity (BMI >= 30 kg/m2).  Use of agents associated with hypertension: none.     Weight trend: stable Wt Readings from Last 3 Encounters:  10/20/16 232 lb 6.4 oz (105.4 kg)  08/30/16 234 lb (106.1 kg)  06/16/16 235 lb (106.6 kg)    Current diet: in general, a "healthy" diet    ------------------------------------------------------------------------ Patient reports when he gave blood on Monday BP was 152/80 there.     Previous Medications   LISINOPRIL (PRINIVIL,ZESTRIL) 10 MG TABLET    Take 1 tablet (10 mg total) by mouth daily.    Review of Systems  Constitutional: Negative.   Eyes: Negative.   Respiratory: Negative.   Cardiovascular: Negative.   Allergic/Immunologic: Negative.   Neurological: Negative.   Psychiatric/Behavioral: Negative.     Social History  Substance Use Topics  . Smoking status: Never Smoker  . Smokeless tobacco: Never Used  . Alcohol use 0.0 oz/week     Comment: 4 per week    Objective:   BP (!) 142/92 (BP Location: Right Arm, Patient Position: Sitting, Cuff Size: Normal)   Pulse 60   Temp 98.4 F (36.9 C) (Oral)   Resp 14   Wt 232 lb 6.4 oz (105.4 kg)   BMI 33.35 kg/m   Physical Exam  Constitutional: He is oriented to person, place, and time. He appears well-developed and well-nourished.  HENT:  Head: Normocephalic and atraumatic.  Right Ear: External ear normal.  Left Ear: External ear normal.  Eyes: Conjunctivae and EOM are normal. Pupils are equal, round, and reactive to light. No scleral icterus.  Neck: Normal range of motion. Neck supple. No thyromegaly present.  Cardiovascular: Normal rate, regular rhythm, normal heart sounds and intact distal pulses.   Pulmonary/Chest: Effort normal and breath sounds normal.  Abdominal: Soft. Bowel sounds are normal.  Musculoskeletal: Normal range of motion.  Neurological: He is alert and oriented to person, place, and time. He has normal reflexes.  Skin: Skin is warm and dry.  Psychiatric: He has a normal mood and affect. His behavior is normal. Judgment and thought content normal.        Assessment & Plan:     1. Essential hypertension Increased Lisinopril to 40 mg daily. Can take two 10 mg tablets until finished with RX you have.  Return in 2 months.  Follow up: Return in about 2 months (around 12/21/2016) for HTN.     I have done the exam and reviewed the  chart and it is accurate to the best of my knowledge. Development worker, community has been used and  any errors in dictation or transcription are unintentional. Miguel Aschoff M.D. [facility]

## 2016-12-22 ENCOUNTER — Ambulatory Visit: Payer: 59 | Admitting: Family Medicine

## 2016-12-29 ENCOUNTER — Encounter: Payer: Self-pay | Admitting: Family Medicine

## 2016-12-29 ENCOUNTER — Ambulatory Visit (INDEPENDENT_AMBULATORY_CARE_PROVIDER_SITE_OTHER): Payer: 59 | Admitting: Family Medicine

## 2016-12-29 VITALS — BP 140/82 | HR 78 | Temp 98.7°F | Resp 16 | Wt 233.0 lb

## 2016-12-29 DIAGNOSIS — I10 Essential (primary) hypertension: Secondary | ICD-10-CM

## 2016-12-29 NOTE — Progress Notes (Signed)
Subjective:  HPI  Hypertension, follow-up:  BP Readings from Last 3 Encounters:  12/29/16 140/82  10/20/16 (!) 142/92  08/30/16 (!) 162/94    He was last seen for hypertension 2 months ago.  BP at that visit was 142/92. Management since that visit includes increased Lisinopril to 40 mg daily. He reports good compliance with treatment. He is not having side effects. Outside blood pressures are not being checked. He is experiencing none.  Patient denies chest pain, chest pressure/discomfort, claudication, dyspnea, exertional chest pressure/discomfort, fatigue, irregular heart beat, lower extremity edema, near-syncope, orthopnea, palpitations, paroxysmal nocturnal dyspnea and syncope.   Wt Readings from Last 3 Encounters:  12/29/16 233 lb (105.7 kg)  10/20/16 232 lb 6.4 oz (105.4 kg)  08/30/16 234 lb (106.1 kg)   ------------------------------------------------------------------------ Pt injured his left knee last year and since his been doing a lot of walking and his dog is getting ready for a tournament next week he has noticed that his knee has been bothering him again. He would like to know what kind of knee brace would work best for his tournament next week.    Prior to Admission medications   Medication Sig Start Date End Date Taking? Authorizing Provider  lisinopril (PRINIVIL,ZESTRIL) 40 MG tablet Take 1 tablet (40 mg total) by mouth daily. 10/20/16   Jameila Keeny Hulen Shouts., MD    Patient Active Problem List   Diagnosis Date Noted  . Hypertension 10/20/2016  . Back pain, chronic 11/04/2015  . 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 HLD (hyperlipidemia) 11/04/2015  . Adiposity 11/04/2015    History reviewed. No pertinent past medical history.  Social History   Social History  . Marital status: Married    Spouse name: N/A  . Number of children: N/A  . Years of education: N/A   Occupational History  . Not on file.   Social History Main  Topics  . Smoking status: Never Smoker  . Smokeless tobacco: Never Used  . Alcohol use 0.0 oz/week     Comment: 4 per week  . Drug use: No  . Sexual activity: Not on file   Other Topics Concern  . Not on file   Social History Narrative  . No narrative on file    No Known Allergies  Review of Systems  Constitutional: Negative.   HENT: Negative.   Eyes: Negative.   Respiratory: Negative.   Cardiovascular: Negative.   Gastrointestinal: Negative.   Musculoskeletal: Positive for joint pain.  Skin: Negative.        Skin lesion on right shoulder  Neurological: Negative.   Endo/Heme/Allergies: Negative.   Psychiatric/Behavioral: Negative.     Immunization History  Administered Date(s) Administered  . Influenza,inj,Quad PF,36+ Mos 08/30/2016  . Tdap 07/24/2008    Objective:  BP 140/82 (BP Location: Left Arm, Patient Position: Sitting, Cuff Size: Large)   Pulse 78   Temp 98.7 F (37.1 C) (Oral)   Resp 16   Wt 233 lb (105.7 kg)   SpO2 97%   BMI 33.43 kg/m   Physical Exam  Constitutional: He is oriented to person, place, and time and well-developed, well-nourished, and in no distress.  HENT:  Head: Normocephalic and atraumatic.  Right Ear: External ear normal.  Left Ear: External ear normal.  Nose: Nose normal.  Eyes: Conjunctivae are normal.  Skin tag on right eyelid   Neck: No thyromegaly present.  Cardiovascular: Normal rate, regular rhythm and normal heart sounds.   Pulmonary/Chest: Effort normal  and breath sounds normal.  Abdominal: Soft.  Neurological: He is alert and oriented to person, place, and time.  Skin: Skin is warm and dry.  Psychiatric: Mood, memory, affect and judgment normal.    Lab Results  Component Value Date   WBC 5.4 12/02/2015   HGB 12.9 (A) 11/28/2014   HCT 38.3 12/02/2015   PLT 206 12/02/2015   GLUCOSE 108 (H) 12/02/2015   CHOL 180 12/02/2015   TRIG 153 (H) 12/02/2015   HDL 30 (L) 12/02/2015   LDLCALC 119 (H) 12/02/2015   TSH  3.270 12/02/2015   PSA 0.4 11/28/2014   HGBA1C 5.4 03/04/2016    CMP     Component Value Date/Time   NA 143 12/02/2015 0942   K 4.5 12/02/2015 0942   CL 102 12/02/2015 0942   CO2 25 12/02/2015 0942   GLUCOSE 108 (H) 12/02/2015 0942   BUN 15 12/02/2015 0942   CREATININE 0.87 12/02/2015 0942   CALCIUM 9.0 12/02/2015 0942   PROT 7.1 12/02/2015 0942   ALBUMIN 4.4 12/02/2015 0942   AST 15 12/02/2015 0942   ALT 9 12/02/2015 0942   ALKPHOS 68 12/02/2015 0942   BILITOT 0.3 12/02/2015 0942   GFRNONAA 94 12/02/2015 0942   GFRAA 108 12/02/2015 0942    Assessment and Plan :  HTN Improving. Diet and exercise stressed.   I have done the exam and reviewed the above chart and it is accurate to the best of my knowledge. Dentist has been used in this note in any air is in the dictation or transcription are unintentional.  Julieanne Manson MD [facility] 12/29/2016 4:09 PM

## 2017-02-25 ENCOUNTER — Other Ambulatory Visit: Payer: Self-pay | Admitting: Family Medicine

## 2017-02-25 DIAGNOSIS — I10 Essential (primary) hypertension: Secondary | ICD-10-CM

## 2017-02-25 MED ORDER — LISINOPRIL 40 MG PO TABS: 40.0000 mg | ORAL_TABLET | Freq: Every day | ORAL | 3 refills | Status: DC

## 2017-02-25 NOTE — Telephone Encounter (Signed)
Done-aa 

## 2017-02-25 NOTE — Telephone Encounter (Signed)
[facility] faxed a request for the following medication. Thanks CC  lisinopril (PRINIVIL,ZESTRIL) 40 MG tablet

## 2017-06-28 ENCOUNTER — Encounter: Payer: Self-pay | Admitting: Family Medicine

## 2017-06-28 ENCOUNTER — Ambulatory Visit (INDEPENDENT_AMBULATORY_CARE_PROVIDER_SITE_OTHER): Payer: 59 | Admitting: Family Medicine

## 2017-06-28 VITALS — BP 140/82 | HR 66 | Temp 98.7°F | Resp 16 | Ht 70.0 in | Wt 230.0 lb

## 2017-06-28 DIAGNOSIS — I10 Essential (primary) hypertension: Secondary | ICD-10-CM

## 2017-06-28 DIAGNOSIS — Z1211 Encounter for screening for malignant neoplasm of colon: Secondary | ICD-10-CM | POA: Diagnosis not present

## 2017-06-28 DIAGNOSIS — Z0000 Encounter for general adult medical examination without abnormal findings: Secondary | ICD-10-CM | POA: Diagnosis not present

## 2017-06-28 DIAGNOSIS — R7309 Other abnormal glucose: Secondary | ICD-10-CM

## 2017-06-28 LAB — POCT URINALYSIS DIPSTICK
Bilirubin, UA: NEGATIVE
Blood, UA: NEGATIVE
Glucose, UA: NEGATIVE
Ketones, UA: NEGATIVE
Leukocytes, UA: NEGATIVE
Nitrite, UA: NEGATIVE
Protein, UA: NEGATIVE
Spec Grav, UA: 1.01 (ref 1.010–1.025)
Urobilinogen, UA: 0.2 E.U./dL
pH, UA: 7 (ref 5.0–8.0)

## 2017-06-28 LAB — IFOBT (OCCULT BLOOD): IFOBT: NEGATIVE

## 2017-06-28 MED ORDER — AMLODIPINE BESYLATE 5 MG PO TABS: 5.0000 mg | ORAL_TABLET | Freq: Every day | ORAL | 3 refills | Status: DC

## 2017-06-28 NOTE — Progress Notes (Signed)
Patient: Travis Brown, Male    DOB: [birth year], 61 y.o.   MRN: [id number] Visit Date: 06/28/2017  Today's Provider: Megan Mans, MD   Chief Complaint  Patient presents with  . Annual Exam   Subjective:    Annual physical exam Travis Brown is a 61 y.o. male who presents today for health maintenance and complete physical. He feels well. He reports he not exercising formally. He reports he is sleeping well.  ----------------------------------------------------------------- He reports that he has been to give blood twice and his iron has been low. The first time it was too low to give double platelets and the second time it was too low to give. They suggested him to take [place] vitamin and he has been doing this.    Colonoscopy- 02/19/15 diverticulosis, otherwise normal. Repeat 10 years  Immunization History  Administered Date(s) Administered  . Influenza,inj,Quad PF,36+ Mos 08/30/2016  . Tdap 07/24/2008     Review of Systems  Constitutional: Negative.   HENT: Negative.   Eyes: Positive for itching.  Respiratory: Negative.   Cardiovascular: Negative.   Gastrointestinal: Negative.   Endocrine: Negative.   Genitourinary: Negative.   Musculoskeletal: Positive for arthralgias.       Pulled/tore right hamstring 4/18. Tweaked distal left achilles last month.  Skin: Negative.   Allergic/Immunologic: Negative.   Neurological: Negative.   Hematological: Negative.   Psychiatric/Behavioral: Negative.     Social History      He  reports that he has never smoked. He has never used smokeless tobacco. He reports that he drinks alcohol. He reports that he does not use drugs.       Social History   Social History  . Marital status: Married    Spouse name: N/A  . Number of children: N/A  . Years of education: N/A   Social History Main Topics  . Smoking status: Never Smoker  . Smokeless tobacco: Never Used  . Alcohol use 0.0 oz/week     Comment: 4 per  week  . Drug use: No  . Sexual activity: Not Asked   Other Topics Concern  . None   Social History Narrative  . None    History reviewed. No pertinent past medical history.   Patient Active Problem List   Diagnosis Date Noted  . Hypertension 10/20/2016  . Back pain, chronic 11/04/2015  . 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 HLD (hyperlipidemia) 11/04/2015  . Adiposity 11/04/2015    Past Surgical History:  Procedure Laterality Date  . FOOT FRACTURE SURGERY     pins put in  . MOUTH SURGERY  06/10/2016    Family History        Family Status  Relation Status  . Father Deceased  . Mother Deceased  . Brother Alive  . Sister Alive  . Brother Alive        His family history includes Dementia in his mother; Diabetes in his father; Heart disease in his brother and father; Hypertension in his brother and father; Kidney failure in his father.     No Known Allergies   Current Outpatient Prescriptions:  .  lisinopril (PRINIVIL,ZESTRIL) 40 MG tablet, Take 1 tablet (40 mg total) by mouth daily., Disp: 90 tablet, Rfl: 3   Patient Care Team: Maple Hudson., MD as PCP - General (Family Medicine)      Objective:   Vitals: BP 140/82 (BP Location: Left Arm, Cuff Size: Large)   Pulse  66   Temp 98.7 F (37.1 C) (Oral)   Resp 16   Ht 5\' 10"  (1.778 m)   Wt 230 lb (104.3 kg)   BMI 33.00 kg/m    Vitals:   06/28/17 0905 06/28/17 0915  BP: (!) 164/90 140/82  Pulse: 66   Resp: 16   Temp: 98.7 F (37.1 C)   TempSrc: Oral   Weight: 230 lb (104.3 kg)   Height: 5\' 10"  (1.778 m)      Physical Exam  Constitutional: He is oriented to person, place, and time. He appears well-developed and well-nourished.  HENT:  Head: Normocephalic and atraumatic.  Right Ear: External ear normal.  Left Ear: External ear normal.  Nose: Nose normal.  Mouth/Throat: Oropharynx is clear and moist.  Eyes: Pupils are equal, round, and reactive to light. Conjunctivae  and EOM are normal.  Neck: Normal range of motion. Neck supple.  Cardiovascular: Normal rate, regular rhythm, normal heart sounds and intact distal pulses.   Pulmonary/Chest: Effort normal and breath sounds normal.  Abdominal: Soft. Bowel sounds are normal.  Waist 42 inches .  Musculoskeletal: Normal range of motion. He exhibits tenderness.  Tender over insertion of left achilles at calcaneous.  Neurological: He is alert and oriented to person, place, and time. He has normal reflexes.  Skin: Skin is warm and dry.  Psychiatric: He has a normal mood and affect. His behavior is normal. Judgment and thought content normal.     Depression Screen PHQ 2/9 Scores 06/28/2017 03/04/2016  PHQ - 2 Score 0 0  PHQ- 9 Score 1 -      Assessment & Plan:     Routine Health Maintenance and Physical Exam  Exercise Activities and Dietary recommendations Goals    None      Immunization History  Administered Date(s) Administered  . Influenza,inj,Quad PF,36+ Mos 08/30/2016  . Tdap 07/24/2008    Health Maintenance  Topic Date Due  . Hepatitis C Screening  Mar 22, 1956  . HIV Screening  04/12/1970  . INFLUENZA VACCINE  06/08/2017  . TETANUS/TDAP  07/24/2018  . COLONOSCOPY  02/18/2025     Discussed health benefits of physical activity, and encouraged him to engage in regular exercise appropriate for his age and condition.  HTN Add Norvasc 5mg . Mild Achilles Tendonitis Avoid painful movements or explosive exercise for now.  Iron Deficiency Pt is O negative and gives blood every 3 months--sometimes double. Check iron and simply wait 6 months until next donation then possibly go to 4 months. --------------------------------------------------------------------   I have done the exam and reviewed the above chart and it is accurate to the best of my knowledge. Dentist has been used in this note in any air is in the dictation or transcription are unintentional.  Megan Mans,  MD  [facility]

## 2017-06-29 LAB — HEMOGLOBIN A1C
Est. average glucose Bld gHb Est-mCnc: 114 mg/dL
Hgb A1c MFr Bld: 5.6 % (ref 4.8–5.6)

## 2017-06-29 LAB — CBC WITH DIFFERENTIAL/PLATELET
Basophils Absolute: 0 10*3/uL (ref 0.0–0.2)
Basos: 0 %
EOS (ABSOLUTE): 0.2 10*3/uL (ref 0.0–0.4)
Eos: 4 %
Hematocrit: 42.1 % (ref 37.5–51.0)
Hemoglobin: 13.8 g/dL (ref 13.0–17.7)
Immature Grans (Abs): 0 10*3/uL (ref 0.0–0.1)
Immature Granulocytes: 0 %
Lymphocytes Absolute: 1.8 10*3/uL (ref 0.7–3.1)
Lymphs: 37 %
MCH: 27.9 pg (ref 26.6–33.0)
MCHC: 32.8 g/dL (ref 31.5–35.7)
MCV: 85 fL (ref 79–97)
Monocytes Absolute: 0.2 10*3/uL (ref 0.1–0.9)
Monocytes: 5 %
Neutrophils Absolute: 2.6 10*3/uL (ref 1.4–7.0)
Neutrophils: 54 %
Platelets: 200 10*3/uL (ref 150–379)
RBC: 4.94 x10E6/uL (ref 4.14–5.80)
RDW: 14.7 % (ref 12.3–15.4)
WBC: 4.9 10*3/uL (ref 3.4–10.8)

## 2017-06-29 LAB — LIPID PANEL WITH LDL/HDL RATIO
Cholesterol, Total: 198 mg/dL (ref 100–199)
HDL: 30 mg/dL — ABNORMAL LOW (ref >39)
LDL Calculated: 134 mg/dL — ABNORMAL HIGH (ref 0–99)
LDl/HDL Ratio: 4.5 ratio — ABNORMAL HIGH (ref 0.0–3.6)
Triglycerides: 170 mg/dL — ABNORMAL HIGH (ref 0–149)
VLDL Cholesterol Cal: 34 mg/dL (ref 5–40)

## 2017-06-29 LAB — COMPREHENSIVE METABOLIC PANEL
ALT: 11 IU/L (ref 0–44)
AST: 22 IU/L (ref 0–40)
Albumin/Globulin Ratio: 1.5 (ref 1.2–2.2)
Albumin: 4.3 g/dL (ref 3.6–4.8)
Alkaline Phosphatase: 62 IU/L (ref 39–117)
BUN/Creatinine Ratio: 12 (ref 10–24)
BUN: 13 mg/dL (ref 8–27)
Bilirubin Total: 0.7 mg/dL (ref 0.0–1.2)
CO2: 24 mmol/L (ref 20–29)
Calcium: 8.9 mg/dL (ref 8.6–10.2)
Chloride: 102 mmol/L (ref 96–106)
Creatinine, Ser: 1.06 mg/dL (ref 0.76–1.27)
GFR calc Af Amer: 87 mL/min/{1.73_m2} (ref >59)
GFR calc non Af Amer: 75 mL/min/{1.73_m2} (ref >59)
Globulin, Total: 2.8 g/dL (ref 1.5–4.5)
Glucose: 92 mg/dL (ref 65–99)
Potassium: 4.3 mmol/L (ref 3.5–5.2)
Sodium: 139 mmol/L (ref 134–144)
Total Protein: 7.1 g/dL (ref 6.0–8.5)

## 2017-06-29 LAB — TSH: TSH: 2.88 u[IU]/mL (ref 0.450–4.500)

## 2017-08-01 ENCOUNTER — Ambulatory Visit (INDEPENDENT_AMBULATORY_CARE_PROVIDER_SITE_OTHER): Payer: Self-pay | Admitting: Family Medicine

## 2017-08-01 VITALS — BP 144/82 | HR 76 | Temp 98.3°F | Resp 14 | Wt 230.0 lb

## 2017-08-01 DIAGNOSIS — I10 Essential (primary) hypertension: Secondary | ICD-10-CM

## 2017-08-01 DIAGNOSIS — E784 Other hyperlipidemia: Secondary | ICD-10-CM | POA: Diagnosis not present

## 2017-08-01 DIAGNOSIS — Z6833 Body mass index (BMI) 33.0-33.9, adult: Secondary | ICD-10-CM | POA: Diagnosis not present

## 2017-08-01 DIAGNOSIS — Z23 Encounter for immunization: Secondary | ICD-10-CM | POA: Diagnosis not present

## 2017-08-01 DIAGNOSIS — E7849 Other hyperlipidemia: Secondary | ICD-10-CM

## 2017-08-01 MED ORDER — AMLODIPINE BESYLATE 10 MG PO TABS: 10.0000 mg | ORAL_TABLET | Freq: Every day | ORAL | 3 refills | Status: DC

## 2017-08-01 NOTE — Progress Notes (Signed)
Travis Brown  MRN: [id number] DOB: [birth year]  Subjective:  HPI  Patient is here for 1 month follow up. Last office visit was on 06/28/17 for CPE. HTN: added Amlodipine on his last visit. Patient is also taking Lisinopril daily. Patient states he checked his b/p one time it was around 120/80s. He has had 2 episodes of feeling extremely tired but it was after intense work out routine and increased heat exposure, this may not be related to taking Amlodipine. No swelling in feet or ankles. BP Readings from Last 3 Encounters:  08/01/17 (!) 144/82  06/28/17 140/82  12/29/16 140/82   Wt Readings from Last 3 Encounters:  08/01/17 230 lb (104.3 kg)  06/28/17 230 lb (104.3 kg)  12/29/16 233 lb (105.7 kg)    Patient Active Problem List   Diagnosis Date Noted  . Hypertension 10/20/2016  . Back pain, chronic 11/04/2015  . 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 HLD (hyperlipidemia) 11/04/2015  . Adiposity 11/04/2015    No past medical history on file.  Social History   Social History  . Marital status: Married    Spouse name: N/A  . Number of children: N/A  . Years of education: N/A   Occupational History  . Not on file.   Social History Main Topics  . Smoking status: Never Smoker  . Smokeless tobacco: Never Used  . Alcohol use 0.0 oz/week     Comment: 4 per week  . Drug use: No  . Sexual activity: Not on file   Other Topics Concern  . Not on file   Social History Narrative  . No narrative on file    Outpatient Encounter Prescriptions as of 08/01/2017  Medication Sig  . amLODipine (NORVASC) 5 MG tablet Take 1 tablet (5 mg total) by mouth daily.  Marland Kitchen lisinopril (PRINIVIL,ZESTRIL) 40 MG tablet Take 1 tablet (40 mg total) by mouth daily.  . Multiple Vitamin (MULTIVITAMIN) tablet Take 1 tablet by mouth daily.   No facility-administered encounter medications on file as of 08/01/2017.     No Known Allergies  Review of Systems  Constitutional:  Negative.   Respiratory: Negative.   Cardiovascular: Negative.   Musculoskeletal: Positive for joint pain (and stiffness off and on).  Neurological: Negative.     Objective:  BP (!) 144/82   Pulse 76   Temp 98.3 F (36.8 C)   Resp 14   Wt 230 lb (104.3 kg)   BMI 33.00 kg/m   Physical Exam  Constitutional: He is oriented to person, place, and time and well-developed, well-nourished, and in no distress.  HENT:  Head: Normocephalic and atraumatic.  Eyes: Pupils are equal, round, and reactive to light. Conjunctivae are normal.  Cardiovascular: Normal rate, regular rhythm, normal heart sounds and intact distal pulses.  Exam reveals no gallop.   No murmur heard. Pulmonary/Chest: Effort normal and breath sounds normal. No respiratory distress. He has no wheezes.  Musculoskeletal: He exhibits no edema or tenderness.  Neurological: He is alert and oriented to person, place, and time.  Psychiatric: Mood, memory, affect and judgment normal.    Assessment and Plan :  1. Essential hypertension Not better. Increase Amlodipine to 10 mg. Continue Lisinopril current dose. Re check on the next visit. - amLODipine (NORVASC) 10 MG tablet; Take 1 tablet (10 mg total) by mouth daily.  Dispense: 90 tablet; Refill: 3  2. BMI 33.0-33.9,adult Continue working on habits.  3. Need for immunization against influenza - Flu Vaccine QUAD  36+ mos IM  4. Hyperlipidemia LDL is not elevated enough to need treatment at this time, no other health issues to warrant treatment.  HPI, Exam and A&P transcribed by Domingo Cocking, RMA under direction and in the presence of Julieanne Manson, MD. I have done the exam and reviewed the chart and it is accurate to the best of my knowledge. Dentist has been used and  any errors in dictation or transcription are unintentional. Julieanne Manson M.D. [facility]

## 2017-09-14 ENCOUNTER — Ambulatory Visit: Payer: 59 | Admitting: Family Medicine

## 2017-09-15 ENCOUNTER — Other Ambulatory Visit: Payer: Self-pay

## 2017-09-15 ENCOUNTER — Ambulatory Visit: Payer: 59 | Admitting: Family Medicine

## 2017-09-15 VITALS — BP 138/76 | HR 64 | Temp 98.3°F | Resp 14 | Wt 230.0 lb

## 2017-09-15 DIAGNOSIS — I10 Essential (primary) hypertension: Secondary | ICD-10-CM

## 2017-09-15 NOTE — Progress Notes (Signed)
Travis Brown  MRN: [id number] DOB: 11/21/1955  Subjective:  HPI   The patient is a 61 year old male who presents for follow up of his hypertension.  He was last seen on 08/01/17.  At that time his blood pressure was 140/82.  He was instructed to increase his Amlodipine to 10 mg daily and return to be seen today.    The patient has not been checking his blood pressure outside of this office. He denies any adverse effect of the medicine, no edema, dizziness or headache.    Patient Active Problem List   Diagnosis Date Noted  . Hypertension 10/20/2016  . Back pain, chronic 11/04/2015  . 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 HLD (hyperlipidemia) 11/04/2015  . Adiposity 11/04/2015    No past medical history on file.  Social History   Socioeconomic History  . Marital status: Married    Spouse name: Not on file  . Number of children: Not on file  . Years of education: Not on file  . Highest education level: Not on file  Social Needs  . Financial resource strain: Not on file  . Food insecurity - worry: Not on file  . Food insecurity - inability: Not on file  . Transportation needs - medical: Not on file  . Transportation needs - non-medical: Not on file  Occupational History  . Not on file  Tobacco Use  . Smoking status: Never Smoker  . Smokeless tobacco: Never Used  Substance and Sexual Activity  . Alcohol use: Yes    Alcohol/week: 0.0 oz    Comment: 4 per week  . Drug use: No  . Sexual activity: Not on file  Other Topics Concern  . Not on file  Social History Narrative  . Not on file    Outpatient Encounter Medications as of 09/15/2017  Medication Sig  . amLODipine (NORVASC) 10 MG tablet Take 1 tablet (10 mg total) by mouth daily.  Marland Kitchen. lisinopril (PRINIVIL,ZESTRIL) 40 MG tablet Take 1 tablet (40 mg total) by mouth daily.  . [DISCONTINUED] Multiple Vitamin (MULTIVITAMIN) tablet Take 1 tablet by mouth daily.   No facility-administered encounter  medications on file as of 09/15/2017.     No Known Allergies  Review of Systems  Constitutional: Negative for fever and malaise/fatigue.  Eyes: Negative.   Respiratory: Negative for cough, shortness of breath and wheezing.   Cardiovascular: Negative for chest pain, palpitations, orthopnea, claudication and leg swelling.  Gastrointestinal: Negative.   Skin: Negative.   Neurological: Negative for dizziness, weakness and headaches.  Endo/Heme/Allergies: Negative.   Psychiatric/Behavioral: Negative.     Objective:  BP 138/76 (BP Location: Right Arm, Patient Position: Sitting, Cuff Size: Normal)   Pulse 64   Temp 98.3 F (36.8 C) (Oral)   Resp 14   Wt 230 lb (104.3 kg)   BMI 33.00 kg/m   Physical Exam  Constitutional: He is oriented to person, place, and time and well-developed, well-nourished, and in no distress.  HENT:  Head: Normocephalic and atraumatic.  Eyes: Conjunctivae are normal. Pupils are equal, round, and reactive to light.  Neck: Normal range of motion.  Cardiovascular: Normal rate, regular rhythm and normal heart sounds.  Pulmonary/Chest: Effort normal and breath sounds normal.  Abdominal: Soft.  Musculoskeletal: He exhibits no edema.  Neurological: He is alert and oriented to person, place, and time. Gait normal. GCS score is 15.  Skin: Skin is warm and dry.  Psychiatric: Mood, memory, affect and judgment normal.  Assessment and Plan :  HTN Controlled.  I have done the exam and reviewed the chart and it is accurate to the best of my knowledge. DentistDragon  technology has been used and  any errors in dictation or transcription are unintentional. Julieanne Mansonichard Carletta Feasel M.D. [facility]

## 2017-10-12 ENCOUNTER — Ambulatory Visit: Payer: 59 | Admitting: Family Medicine

## 2018-03-04 ENCOUNTER — Other Ambulatory Visit: Payer: Self-pay | Admitting: Family Medicine

## 2018-03-04 DIAGNOSIS — I10 Essential (primary) hypertension: Secondary | ICD-10-CM

## 2018-03-13 ENCOUNTER — Ambulatory Visit (INDEPENDENT_AMBULATORY_CARE_PROVIDER_SITE_OTHER): Payer: Managed Care, Other (non HMO) | Admitting: Family Medicine

## 2018-03-13 ENCOUNTER — Other Ambulatory Visit: Payer: Self-pay

## 2018-03-13 VITALS — BP 138/74 | HR 68 | Temp 98.4°F | Resp 16 | Wt 224.0 lb

## 2018-03-13 DIAGNOSIS — I10 Essential (primary) hypertension: Secondary | ICD-10-CM | POA: Diagnosis not present

## 2018-03-13 DIAGNOSIS — E7849 Other hyperlipidemia: Secondary | ICD-10-CM | POA: Diagnosis not present

## 2018-03-13 NOTE — Progress Notes (Signed)
Travis K Bertha GAREY ALLEVA44 DOB: 06-15-1956  Subjective:  HPI   The patient is a 62 year old male who presents for follow up of his hypertension.  He was last seen on 09/15/17.  His blood pressure has been controlled on Lisinopril and Amlodipine. BP Readings from Last 3 Encounters:  03/13/18 138/74  09/15/17 138/76  08/01/17 (!) 144/82   His last physical was on 06/28/17.  He is scheduled for his next physical while in the office today.   Patient Active Problem List   Diagnosis Date Noted  . Hypertension 10/20/2016  . Back pain, chronic 11/04/2015  . 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 HLD (hyperlipidemia) 11/04/2015  . Adiposity 11/04/2015    No past medical history on file.  Social History   Socioeconomic History  . Marital status: Married    Spouse name: Not on file  . Number of children: Not on file  . Years of education: Not on file  . Highest education level: Not on file  Occupational History  . Not on file  Social Needs  . Financial resource strain: Not on file  . Food insecurity:    Worry: Not on file    Inability: Not on file  . Transportation needs:    Medical: Not on file    Non-medical: Not on file  Tobacco Use  . Smoking status: Never Smoker  . Smokeless tobacco: Never Used  Substance and Sexual Activity  . Alcohol use: Yes    Alcohol/week: 0.0 oz    Comment: 4 per week  . Drug use: No  . Sexual activity: Not on file  Lifestyle  . Physical activity:    Days per week: Not on file    Minutes per session: Not on file  . Stress: Not on file  Relationships  . Social connections:    Talks on phone: Not on file    Gets together: Not on file    Attends religious service: Not on file    Active member of club or organization: Not on file    Attends meetings of clubs or organizations: Not on file    Relationship status: Not on file  . Intimate partner violence:    Fear of current or ex partner: Not on file   Emotionally abused: Not on file    Physically abused: Not on file    Forced sexual activity: Not on file  Other Topics Concern  . Not on file  Social History Narrative  . Not on file    Outpatient Encounter Medications as of 03/13/2018  Medication Sig  . amLODipine (NORVASC) 10 MG tablet Take 1 tablet (10 mg total) by mouth daily.  Marland Kitchen lisinopril (PRINIVIL,ZESTRIL) 40 MG tablet TAKE 1 TABLET BY MOUTH  DAILY   No facility-administered encounter medications on file as of 03/13/2018.     No Known Allergies  Review of Systems  Constitutional: Negative for fever and malaise/fatigue.  HENT: Positive for congestion (allergy related).   Eyes: Negative.   Respiratory: Negative for cough, shortness of breath and wheezing.   Cardiovascular: Negative for chest pain, palpitations, orthopnea, claudication and leg swelling.  Gastrointestinal: Negative.   Skin: Negative.   Neurological: Negative for dizziness and headaches.  Endo/Heme/Allergies: Negative.   Psychiatric/Behavioral: Negative.     Objective:  BP 138/74 (BP Location: Right Arm, Patient Position: Sitting, Cuff Size: Normal)   Pulse 68   Temp 98.4 F (36.9 C) (Oral)   Resp 16  Wt 224 lb (101.6 kg)   BMI 32.14 kg/m   Physical Exam  Constitutional: He is oriented to person, place, and time and well-developed, well-nourished, and in no distress.  HENT:  Head: Normocephalic and atraumatic.  Eyes: Conjunctivae are normal. No scleral icterus.  Neck: No thyromegaly present.  Cardiovascular: Normal rate, regular rhythm and normal heart sounds.  Pulmonary/Chest: Effort normal and breath sounds normal.  Abdominal: Soft.  Musculoskeletal: He exhibits no edema.  Neurological: He is alert and oriented to person, place, and time. Gait normal. GCS score is 15.  Skin: Skin is warm and dry.  Psychiatric: Mood, memory, affect and judgment normal.    Assessment and Plan :  HTN HLD Stable. CPE this fall.  I have done the exam and  reviewed the chart and it is accurate to the best of my knowledge. Dentist has been used and  any errors in dictation or transcription are unintentional. Julieanne Manson M.D. [facility]

## 2018-03-15 ENCOUNTER — Ambulatory Visit: Payer: Self-pay | Admitting: Family Medicine

## 2018-06-01 ENCOUNTER — Other Ambulatory Visit: Payer: Self-pay | Admitting: Family Medicine

## 2018-06-01 DIAGNOSIS — I10 Essential (primary) hypertension: Secondary | ICD-10-CM

## 2018-06-29 ENCOUNTER — Encounter: Payer: Self-pay | Admitting: Family Medicine

## 2018-07-18 ENCOUNTER — Encounter: Payer: Self-pay | Admitting: Family Medicine

## 2018-07-18 ENCOUNTER — Ambulatory Visit (INDEPENDENT_AMBULATORY_CARE_PROVIDER_SITE_OTHER): Payer: Managed Care, Other (non HMO) | Admitting: Family Medicine

## 2018-07-18 VITALS — BP 124/62 | HR 76 | Temp 99.0°F | Resp 16 | Ht 70.0 in | Wt 229.0 lb

## 2018-07-18 DIAGNOSIS — Z125 Encounter for screening for malignant neoplasm of prostate: Secondary | ICD-10-CM | POA: Diagnosis not present

## 2018-07-18 DIAGNOSIS — I10 Essential (primary) hypertension: Secondary | ICD-10-CM

## 2018-07-18 DIAGNOSIS — Z23 Encounter for immunization: Secondary | ICD-10-CM

## 2018-07-18 DIAGNOSIS — E7849 Other hyperlipidemia: Secondary | ICD-10-CM | POA: Diagnosis not present

## 2018-07-18 DIAGNOSIS — Z0000 Encounter for general adult medical examination without abnormal findings: Secondary | ICD-10-CM

## 2018-07-18 LAB — POCT URINALYSIS DIPSTICK
BILIRUBIN UA: NEGATIVE
Blood, UA: NEGATIVE
GLUCOSE UA: NEGATIVE
KETONES UA: NEGATIVE
Leukocytes, UA: NEGATIVE
Nitrite, UA: NEGATIVE
PH UA: 6.5 (ref 5.0–8.0)
Protein, UA: NEGATIVE
SPEC GRAV UA: 1.02 (ref 1.010–1.025)
Urobilinogen, UA: 0.2 E.U./dL

## 2018-07-18 NOTE — Progress Notes (Signed)
Patient: Travis Brown, Male    DOB: 21-Jan-1956, 63 y.o.   MRN: [id number] Visit Date: 07/18/2018  Today's Provider: Megan Mans, MD   Chief Complaint  Patient presents with  . Annual Exam   Subjective:    Annual physical exam Travis Brown is a 62 y.o. male who presents today for health maintenance and complete physical. He feels well. He reports exercising not regularly, but he does stay active. He reports he is sleeping well.   Colonoscopy- 02/19/2015. Diverticulosis. Repeat in 10 years.      Review of Systems  Constitutional: Negative.   HENT: Negative.   Eyes: Negative.   Respiratory: Negative.   Cardiovascular: Negative.   Gastrointestinal: Negative.   Endocrine: Negative.   Genitourinary: Negative.   Musculoskeletal: Negative.   Allergic/Immunologic: Negative.   Neurological: Negative.   Hematological: Negative.   Psychiatric/Behavioral: Negative.     Social History      He  reports that he has never smoked. He has never used smokeless tobacco. He reports that he drinks alcohol. He reports that he does not use drugs.       Social History   Socioeconomic History  . Marital status: Married    Spouse name: Not on file  . Number of children: Not on file  . Years of education: Not on file  . Highest education level: Not on file  Occupational History  . Not on file  Social Needs  . Financial resource strain: Not on file  . Food insecurity:    Worry: Not on file    Inability: Not on file  . Transportation needs:    Medical: Not on file    Non-medical: Not on file  Tobacco Use  . Smoking status: Never Smoker  . Smokeless tobacco: Never Used  Substance and Sexual Activity  . Alcohol use: Yes    Alcohol/week: 0.0 standard drinks    Comment: 4 per week  . Drug use: No  . Sexual activity: Not on file  Lifestyle  . Physical activity:    Days per week: Not on file    Minutes per session: Not on file  . Stress: Not on file    Relationships  . Social connections:    Talks on phone: Not on file    Gets together: Not on file    Attends religious service: Not on file    Active member of club or organization: Not on file    Attends meetings of clubs or organizations: Not on file    Relationship status: Not on file  Other Topics Concern  . Not on file  Social History Narrative  . Not on file    No past medical history on file.   Patient Active Problem List   Diagnosis Date Noted  . Hypertension 10/20/2016  . Back pain, chronic 11/04/2015  . 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 HLD (hyperlipidemia) 11/04/2015  . Adiposity 11/04/2015    Past Surgical History:  Procedure Laterality Date  . FOOT FRACTURE SURGERY     pins put in  . MOUTH SURGERY  06/10/2016    Family History        Family Status  Relation Name Status  . Father  Deceased  . Mother  Deceased  . Brother  Alive  . Sister  Alive  . Brother  Alive        His family history includes Dementia in his mother; Diabetes in  his father; Heart disease in his brother and father; Hypertension in his brother and father; Kidney failure in his father.      No Known Allergies   Current Outpatient Medications:  .  amLODipine (NORVASC) 10 MG tablet, TAKE 1 TABLET BY MOUTH  DAILY, Disp: 90 tablet, Rfl: 3 .  lisinopril (PRINIVIL,ZESTRIL) 40 MG tablet, TAKE 1 TABLET BY MOUTH  DAILY, Disp: 90 tablet, Rfl: 3   Patient Care Team: Maple Hudson., MD as PCP - General (Family Medicine)      Objective:   Vitals: BP 124/62 (BP Location: Right Arm, Patient Position: Sitting, Cuff Size: Normal)   Pulse 76   Temp 99 F (37.2 C)   Resp 16   Ht 5\' 10"  (1.778 m)   Wt 229 lb (103.9 kg)   BMI 32.86 kg/m    Vitals:   07/18/18 1414  BP: 124/62  Pulse: 76  Resp: 16  Temp: 99 F (37.2 C)  Weight: 229 lb (103.9 kg)  Height: 5\' 10"  (1.778 m)     Physical Exam  Constitutional: He is oriented to person, place, and time.  He appears well-developed and well-nourished.  HENT:  Head: Normocephalic and atraumatic.  Right Ear: External ear normal.  Left Ear: External ear normal.  Nose: Nose normal.  Mouth/Throat: Oropharynx is clear and moist.  Eyes: Conjunctivae are normal. No scleral icterus.  Neck: No thyromegaly present.  Cardiovascular: Normal rate, regular rhythm, normal heart sounds and intact distal pulses.  Pulmonary/Chest: Effort normal and breath sounds normal.  Abdominal: Soft.  Genitourinary: Rectum normal, prostate normal and penis normal.  Musculoskeletal: He exhibits no edema.  Mild tenderness of left medial achilles insertion.  Neurological: He is alert and oriented to person, place, and time.  Skin: Skin is warm and dry.  Psychiatric: He has a normal mood and affect. His behavior is normal. Judgment and thought content normal.     Depression Screen PHQ 2/9 Scores 06/28/2017 03/04/2016  PHQ - 2 Score 0 0  PHQ- 9 Score 1 -      Assessment & Plan:     Routine Health Maintenance and Physical Exam  Exercise Activities and Dietary recommendations Goals   None     Immunization History  Administered Date(s) Administered  . Influenza,inj,Quad PF,6+ Mos 08/30/2016, 08/01/2017  . Tdap 07/24/2008    Health Maintenance  Topic Date Due  . Hepatitis C Screening  10/06/1956  . HIV Screening  04/12/1970  . INFLUENZA VACCINE  06/08/2018  . TETANUS/TDAP  07/24/2018  . COLONOSCOPY  02/18/2025     Discussed health benefits of physical activity, and encouraged him to engage in regular exercise appropriate for his age and condition.   1. Annual physical exam  - POCT urinalysis dipstick  2. Need for influenza vaccination  - Flu Vaccine QUAD 6+ mos PF IM (Fluarix Quad PF)  3. Prostate cancer screening  - PSA  4. Other hyperlipidemia  - Lipid panel - TSH  5. Essential hypertension  - CBC with Differential/Platelet - Comprehensive metabolic panel  6. Need for Td  vaccine  - Td : Tetanus/diphtheria >7yo Preservative  free  I have done the exam and reviewed the above chart and it is accurate to the best of my knowledge. Dentist has been used in this note in any air is in the dictation or transcription are unintentional.  Megan Mans, MD  [facility]

## 2019-01-09 ENCOUNTER — Other Ambulatory Visit: Payer: Self-pay | Admitting: Family Medicine

## 2019-01-09 DIAGNOSIS — I10 Essential (primary) hypertension: Secondary | ICD-10-CM

## 2019-03-14 ENCOUNTER — Other Ambulatory Visit: Payer: Self-pay

## 2019-03-14 ENCOUNTER — Ambulatory Visit (INDEPENDENT_AMBULATORY_CARE_PROVIDER_SITE_OTHER): Payer: Managed Care, Other (non HMO) | Admitting: Family Medicine

## 2019-03-14 DIAGNOSIS — Z23 Encounter for immunization: Secondary | ICD-10-CM

## 2019-03-14 NOTE — Progress Notes (Signed)
S 

## 2019-04-08 ENCOUNTER — Other Ambulatory Visit: Payer: Self-pay | Admitting: Family Medicine

## 2019-04-08 DIAGNOSIS — I10 Essential (primary) hypertension: Secondary | ICD-10-CM

## 2019-05-22 ENCOUNTER — Ambulatory Visit: Payer: Managed Care, Other (non HMO) | Admitting: Family Medicine

## 2019-05-28 ENCOUNTER — Other Ambulatory Visit: Payer: Self-pay

## 2019-05-28 ENCOUNTER — Ambulatory Visit (INDEPENDENT_AMBULATORY_CARE_PROVIDER_SITE_OTHER): Payer: Managed Care, Other (non HMO) | Admitting: Family Medicine

## 2019-05-28 DIAGNOSIS — Z23 Encounter for immunization: Secondary | ICD-10-CM

## 2019-05-28 NOTE — Progress Notes (Signed)
Shingles shot only.

## 2019-05-29 ENCOUNTER — Telehealth: Payer: Self-pay | Admitting: Family Medicine

## 2019-05-29 NOTE — Telephone Encounter (Signed)
Pt had 2nd shingles vaccine yesterday.  He is having the following today:  Fever - 100. Tired Soreness injection area  Please advise.  Thanks, [employer]

## 2019-05-29 NOTE — Telephone Encounter (Signed)
Normal side effects.Should last only a day or 2.

## 2019-05-30 NOTE — Telephone Encounter (Signed)
Patient was advised and states that he feels much better. FYI

## 2019-08-29 ENCOUNTER — Ambulatory Visit: Payer: Managed Care, Other (non HMO) | Admitting: Family Medicine

## 2019-09-04 NOTE — Progress Notes (Signed)
Patient: Travis Brown, Male    DOB: 1956-07-14, 63 y.o.   MRN: [id number] Visit Date: 09/05/2019  Today's Provider: Wilhemena Durie, MD   Chief Complaint  Patient presents with  . Annual Exam   Subjective:     Annual physical exam Travis Brown is a 63 y.o. male who presents today for health maintenance and complete physical. He feels well. He reports exercising not regularly, but he stay active. He reports he is sleeping well.  Colonoscopy- 02/19/2015. Diverticulosis, otherwise normal. Repeat 10 years.   Immunization History  Administered Date(s) Administered  . Influenza,inj,Quad PF,6+ Mos 08/30/2016, 08/01/2017, 07/18/2018  . Td 07/18/2018  . Tdap 07/24/2008  . Zoster Recombinat (Shingrix) 03/14/2019, 05/28/2019      Review of Systems  Constitutional: Negative.   HENT: Negative.   Eyes: Negative.   Respiratory: Negative.   Cardiovascular: Negative.   Gastrointestinal: Negative.   Endocrine: Negative.   Genitourinary: Negative.   Musculoskeletal: Negative.   Allergic/Immunologic: Negative.   Neurological: Negative.   Hematological: Negative.   Psychiatric/Behavioral: The patient is nervous/anxious.        "Anxiety attacks " as pt overwhelmed with work.    Social History      He  reports that he has never smoked. He has never used smokeless tobacco. He reports current alcohol use. He reports that he does not use drugs.       Social History   Socioeconomic History  . Marital status: Married    Spouse name: Not on file  . Number of children: Not on file  . Years of education: Not on file  . Highest education level: Not on file  Occupational History  . Not on file  Social Needs  . Financial resource strain: Not on file  . Food insecurity    Worry: Not on file    Inability: Not on file  . Transportation needs    Medical: Not on file    Non-medical: Not on file  Tobacco Use  . Smoking status: Never Smoker  . Smokeless tobacco: Never  Used  Substance and Sexual Activity  . Alcohol use: Yes    Alcohol/week: 0.0 standard drinks    Comment: 4 per week  . Drug use: No  . Sexual activity: Not on file  Lifestyle  . Physical activity    Days per week: Not on file    Minutes per session: Not on file  . Stress: Not on file  Relationships  . Social Herbalist on phone: Not on file    Gets together: Not on file    Attends religious service: Not on file    Active member of club or organization: Not on file    Attends meetings of clubs or organizations: Not on file    Relationship status: Not on file  Other Topics Concern  . Not on file  Social History Narrative  . Not on file    No past medical history on file.   Patient Active Problem List   Diagnosis Date Noted  . Hypertension 10/20/2016  . Back pain, chronic 11/04/2015  . 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 HLD (hyperlipidemia) 11/04/2015  . Adiposity 11/04/2015    Past Surgical History:  Procedure Laterality Date  . FOOT FRACTURE SURGERY     pins put in  . MOUTH SURGERY  06/10/2016    Family History        Family Status  Relation Name Status  . Father  Deceased  . Mother  Deceased  . Brother  Alive  . Sister  Alive  . Brother  Alive        His family history includes Dementia in his mother; Diabetes in his father; Heart disease in his brother and father; Hypertension in his brother and father; Kidney failure in his father.      No Known Allergies   Current Outpatient Medications:  .  amLODipine (NORVASC) 10 MG tablet, TAKE 1 TABLET BY MOUTH  DAILY, Disp: 90 tablet, Rfl: 3 .  lisinopril (PRINIVIL,ZESTRIL) 40 MG tablet, TAKE 1 TABLET BY MOUTH  DAILY, Disp: 90 tablet, Rfl: 3   Patient Care Team: Maple Hudson., MD as PCP - General (Family Medicine)    Objective:    Vitals: BP 134/76   Pulse 68   Temp 98.1 F (36.7 C)   Resp 16   Ht 5\' 10"  (1.778 m)   Wt 222 lb (100.7 kg)   SpO2 98%   BMI  31.85 kg/m    Vitals:   09/05/19 1530  BP: 134/76  Pulse: 68  Resp: 16  Temp: 98.1 F (36.7 C)  SpO2: 98%  Weight: 222 lb (100.7 kg)  Height: 5\' 10"  (1.778 m)     Physical Exam   Depression Screen PHQ 2/9 Scores 09/05/2019 07/18/2018 06/28/2017 03/04/2016  PHQ - 2 Score 3 0 0 0  PHQ- 9 Score 5 - 1 -       Assessment & Plan:     Routine Health Maintenance and Physical Exam  Exercise Activities and Dietary recommendations Goals   None     Immunization History  Administered Date(s) Administered  . Influenza,inj,Quad PF,6+ Mos 08/30/2016, 08/01/2017, 07/18/2018  . Td 07/18/2018  . Tdap 07/24/2008  . Zoster Recombinat (Shingrix) 03/14/2019, 05/28/2019    Health Maintenance  Topic Date Due  . Hepatitis C Screening  [birth year]  . HIV Screening  04/12/1970  . INFLUENZA VACCINE  06/09/2019  . COLONOSCOPY  02/18/2025  . TETANUS/TDAP  07/18/2028     Discussed health benefits of physical activity, and encouraged him to engage in regular exercise appropriate for his age and condition.   1. Annual physical exam  - CBC with Differential/Platelet - Comprehensive metabolic panel - Lipid panel - TSH  2. Anxiety Pt planning to retire in 2021 and feels ok about this.     09/17/2028, MD  [facility]

## 2019-09-05 ENCOUNTER — Other Ambulatory Visit: Payer: Self-pay

## 2019-09-05 ENCOUNTER — Encounter: Payer: Self-pay | Admitting: Family Medicine

## 2019-09-05 ENCOUNTER — Ambulatory Visit (INDEPENDENT_AMBULATORY_CARE_PROVIDER_SITE_OTHER): Payer: Managed Care, Other (non HMO) | Admitting: Family Medicine

## 2019-09-05 VITALS — BP 134/76 | HR 68 | Temp 98.1°F | Resp 16 | Ht 70.0 in | Wt 222.0 lb

## 2019-09-05 DIAGNOSIS — Z0000 Encounter for general adult medical examination without abnormal findings: Secondary | ICD-10-CM | POA: Diagnosis not present

## 2019-09-05 DIAGNOSIS — F419 Anxiety disorder, unspecified: Secondary | ICD-10-CM | POA: Diagnosis not present

## 2019-09-20 LAB — CBC WITH DIFFERENTIAL/PLATELET
Basophils Absolute: 0.1 10*3/uL (ref 0.0–0.2)
Basos: 1 %
EOS (ABSOLUTE): 0.2 10*3/uL (ref 0.0–0.4)
Eos: 4 %
Hematocrit: 41.5 % (ref 37.5–51.0)
Hemoglobin: 14.4 g/dL (ref 13.0–17.7)
Immature Grans (Abs): 0 10*3/uL (ref 0.0–0.1)
Immature Granulocytes: 0 %
Lymphocytes Absolute: 1.3 10*3/uL (ref 0.7–3.1)
Lymphs: 27 %
MCH: 29.6 pg (ref 26.6–33.0)
MCHC: 34.7 g/dL (ref 31.5–35.7)
MCV: 85 fL (ref 79–97)
Monocytes Absolute: 0.3 10*3/uL (ref 0.1–0.9)
Monocytes: 6 %
Neutrophils Absolute: 3 10*3/uL (ref 1.4–7.0)
Neutrophils: 62 %
Platelets: 215 10*3/uL (ref 150–450)
RBC: 4.86 x10E6/uL (ref 4.14–5.80)
RDW: 13.1 % (ref 11.6–15.4)
WBC: 4.9 10*3/uL (ref 3.4–10.8)

## 2019-09-20 LAB — LIPID PANEL
Chol/HDL Ratio: 5.9 ratio — ABNORMAL HIGH (ref 0.0–5.0)
Cholesterol, Total: 213 mg/dL — ABNORMAL HIGH (ref 100–199)
HDL: 36 mg/dL — ABNORMAL LOW (ref >39)
LDL Chol Calc (NIH): 152 mg/dL — ABNORMAL HIGH (ref 0–99)
Triglycerides: 136 mg/dL (ref 0–149)
VLDL Cholesterol Cal: 25 mg/dL (ref 5–40)

## 2019-09-20 LAB — COMPREHENSIVE METABOLIC PANEL
ALT: 8 IU/L (ref 0–44)
AST: 17 IU/L (ref 0–40)
Albumin/Globulin Ratio: 1.7 (ref 1.2–2.2)
Albumin: 4.5 g/dL (ref 3.8–4.8)
Alkaline Phosphatase: 69 IU/L (ref 39–117)
BUN/Creatinine Ratio: 14 (ref 10–24)
BUN: 14 mg/dL (ref 8–27)
Bilirubin Total: 0.3 mg/dL (ref 0.0–1.2)
CO2: 26 mmol/L (ref 20–29)
Calcium: 9.4 mg/dL (ref 8.6–10.2)
Chloride: 104 mmol/L (ref 96–106)
Creatinine, Ser: 1.02 mg/dL (ref 0.76–1.27)
GFR calc Af Amer: 89 mL/min/{1.73_m2} (ref >59)
GFR calc non Af Amer: 77 mL/min/{1.73_m2} (ref >59)
Globulin, Total: 2.6 g/dL (ref 1.5–4.5)
Glucose: 98 mg/dL (ref 65–99)
Potassium: 4.5 mmol/L (ref 3.5–5.2)
Sodium: 143 mmol/L (ref 134–144)
Total Protein: 7.1 g/dL (ref 6.0–8.5)

## 2019-09-20 LAB — TSH: TSH: 2.26 u[IU]/mL (ref 0.450–4.500)

## 2019-09-21 ENCOUNTER — Telehealth: Payer: Self-pay

## 2019-09-21 NOTE — Telephone Encounter (Signed)
-----   Message from Jerrol Banana., MD sent at 09/21/2019  8:45 AM EST ----- Labs ok--diet and exercise.

## 2019-09-21 NOTE — Telephone Encounter (Signed)
Called and spoke with patient and informed him of his lab results. He gave verbal understanding.  

## 2019-12-07 ENCOUNTER — Other Ambulatory Visit: Payer: Self-pay | Admitting: Family Medicine

## 2019-12-07 DIAGNOSIS — I10 Essential (primary) hypertension: Secondary | ICD-10-CM

## 2020-03-01 ENCOUNTER — Other Ambulatory Visit: Payer: Self-pay | Admitting: Family Medicine

## 2020-03-01 DIAGNOSIS — I10 Essential (primary) hypertension: Secondary | ICD-10-CM

## 2020-03-01 NOTE — Telephone Encounter (Signed)
Requested Prescriptions  Pending Prescriptions Disp Refills  . amLODipine (NORVASC) 10 MG tablet [Pharmacy Med Name: AMLODIPINE  10MG   TAB] 90 tablet 0    Sig: TAKE 1 TABLET BY MOUTH  DAILY     Cardiovascular:  Calcium Channel Blockers Failed - 03/01/2020 10:26 PM      Failed - Valid encounter within last 6 months    Recent Outpatient Visits          5 months ago Annual physical exam   [facility]   9 months ago Need for shingles vaccine   [facility] MD   11 months ago Need for shingles vaccine   [facility] MD   1 year ago Annual physical exam   [facility] MD   1 year ago Essential hypertension   [facility] MD             Passed - Last BP in normal range    BP Readings from Last 1 Encounters:  09/05/19 134/76         Valid encounter 5 months ago.

## 2020-05-21 ENCOUNTER — Other Ambulatory Visit: Payer: Self-pay | Admitting: Family Medicine

## 2020-05-21 DIAGNOSIS — I10 Essential (primary) hypertension: Secondary | ICD-10-CM

## 2020-05-21 NOTE — Telephone Encounter (Signed)
Requested  medications are  due for refill today yes  Requested medications are on the active medication list yes  Last refill 5/24 (mail order)  Last visit 08/2019  Future visit scheduled 09/08/2020  Notes to clinic Failed protocol due to not a valid visit within 6 months.

## 2020-07-23 ENCOUNTER — Telehealth: Payer: Self-pay

## 2020-07-23 NOTE — Telephone Encounter (Signed)
Copied from CRM [id number]. Topic: Appointment Scheduling - Scheduling Inquiry for Clinic >> Jul 23, 2020  1:52 PM Travis Brown E wrote: Reason for CRM: Pt is a new enrollee into medicare and would like a welcome to medicare appt/ Pt has a CPE appt scheduled and wants to know if he still needs that appt / if so Pt wants to know if AWV can be on the same day / please advise

## 2020-07-23 NOTE — Telephone Encounter (Signed)
Ok to make Nov 1st appt a Welcome to Medicare visit? Please advise. Thanks!

## 2020-07-29 NOTE — Telephone Encounter (Signed)
yes

## 2020-07-29 NOTE — Telephone Encounter (Signed)
Done

## 2020-09-08 ENCOUNTER — Other Ambulatory Visit: Payer: Self-pay

## 2020-09-08 ENCOUNTER — Encounter: Payer: Self-pay | Admitting: Family Medicine

## 2020-09-08 ENCOUNTER — Ambulatory Visit (INDEPENDENT_AMBULATORY_CARE_PROVIDER_SITE_OTHER): Payer: Managed Care, Other (non HMO) | Admitting: Family Medicine

## 2020-09-08 VITALS — BP 124/76 | HR 73 | Temp 99.0°F | Resp 16 | Ht 70.0 in | Wt 223.0 lb

## 2020-09-08 DIAGNOSIS — Z0000 Encounter for general adult medical examination without abnormal findings: Secondary | ICD-10-CM | POA: Diagnosis not present

## 2020-09-08 DIAGNOSIS — Z23 Encounter for immunization: Secondary | ICD-10-CM | POA: Diagnosis not present

## 2020-09-08 DIAGNOSIS — Z1211 Encounter for screening for malignant neoplasm of colon: Secondary | ICD-10-CM

## 2020-09-08 LAB — IFOBT (OCCULT BLOOD): IFOBT: NEGATIVE

## 2020-09-08 NOTE — Progress Notes (Signed)
Annual Wellness Visit     Patient: Travis Brown, Male    DOB: 06-Jul-1956, 64 y.o.   MRN: [id number] Visit Date: 09/08/2020  Today's Provider: Megan Mans, MD   Chief Complaint  Patient presents with  . Welcome to medicare   Subjective    Travis Brown is a 64 y.o. male who presents today for his Annual Wellness Visit.Welcome to [employer]. He reports consuming a general diet. The patient does not participate in regular exercise at present. He generally feels well. He reports sleeping well. He does not have additional problems to discuss today.         Medications: Outpatient Medications Prior to Visit  Medication Sig  . amLODipine (NORVASC) 10 MG tablet TAKE 1 TABLET BY MOUTH  DAILY  . lisinopril (ZESTRIL) 40 MG tablet TAKE 1 TABLET BY MOUTH  DAILY   No facility-administered medications prior to visit.    No Known Allergies  Patient Care Team: Maple Hudson., MD as PCP - General (Family Medicine)  Review of Systems  Constitutional: Negative.   HENT: Negative.   Eyes: Negative.   Respiratory: Negative.   Cardiovascular: Negative.   Gastrointestinal: Negative.   Endocrine: Negative.   Genitourinary: Negative.   Musculoskeletal: Negative.   Skin: Negative.   Neurological: Negative.   Hematological: Negative.        Objective    Vitals: BP 124/76   Pulse 73   Temp 99 F (37.2 C)   Resp 16   Ht 5\' 10"  (1.778 m)   Wt 223 lb (101.2 kg)   BMI 32.00 kg/m  BP Readings from Last 3 Encounters:  09/08/20 124/76  09/05/19 134/76  07/18/18 124/62   Wt Readings from Last 3 Encounters:  09/08/20 223 lb (101.2 kg)  09/05/19 222 lb (100.7 kg)  07/18/18 229 lb (103.9 kg)      Physical Exam Vitals reviewed.  Constitutional:      Appearance: He is well-developed.  HENT:     Head: Normocephalic and atraumatic.     Right Ear: External ear normal.     Left Ear: External ear normal.     Nose: Nose normal.  Eyes:     General: No  scleral icterus.    Conjunctiva/sclera: Conjunctivae normal.  Neck:     Thyroid: No thyromegaly.  Cardiovascular:     Rate and Rhythm: Normal rate and regular rhythm.     Heart sounds: Normal heart sounds.  Pulmonary:     Effort: Pulmonary effort is normal.     Breath sounds: Normal breath sounds.  Abdominal:     Palpations: Abdomen is soft.  Genitourinary:    Penis: Normal.      Testes: Normal.     Prostate: Normal.     Rectum: Normal.  Skin:    General: Skin is warm and dry.  Neurological:     General: No focal deficit present.     Mental Status: He is alert and oriented to person, place, and time.  Psychiatric:        Mood and Affect: Mood normal.        Behavior: Behavior normal.        Thought Content: Thought content normal.        Judgment: Judgment normal.       Most recent functional status assessment: In your present state of health, do you have any difficulty performing the following activities: 09/08/2020  Hearing? N  Vision? N  Difficulty  concentrating or making decisions? N  Walking or climbing stairs? N  Dressing or bathing? N  Doing errands, shopping? N  Some recent data might be hidden   Most recent fall risk assessment: Fall Risk  09/08/2020  Falls in the past year? 0  Number falls in past yr: 0  Injury with Fall? 0  Risk for fall due to : No Fall Risks  Follow up Falls evaluation completed    Most recent depression screenings: PHQ 2/9 Scores 09/08/2020 09/05/2019  PHQ - 2 Score 0 3  PHQ- 9 Score - 5   Most recent cognitive screening: 6CIT Screen 09/08/2020  What Year? 0 points  What month? 0 points  What time? 0 points  Count back from 20 0 points  Months in reverse 0 points  Repeat phrase 0 points  Total Score 0   Most recent Audit-C alcohol use screening Alcohol Use Disorder Test (AUDIT) 09/08/2020  1. How often do you have a drink containing alcohol? 3  2. How many drinks containing alcohol do you have on a typical day when you are  drinking? 0  3. How often do you have six or more drinks on one occasion? 0  AUDIT-C Score 3  4. How often during the last year have you found that you were not able to stop drinking once you had started? 0  5. How often during the last year have you failed to do what was normally expected from you because of drinking? 0  6. How often during the last year have you needed a first drink in the morning to get yourself going after a heavy drinking session? 0  7. How often during the last year have you had a feeling of guilt of remorse after drinking? 0  8. How often during the last year have you been unable to remember what happened the night before because you had been drinking? 0  9. Have you or someone else been injured as a result of your drinking? 0  10. Has a relative or friend or a doctor or another health worker been concerned about your drinking or suggested you cut down? 0  Alcohol Use Disorder Identification Test Final Score (AUDIT) 3  Alcohol Brief Interventions/Follow-up AUDIT Score <7 follow-up not indicated   A score of 3 or more in women, and 4 or more in men indicates increased risk for alcohol abuse, EXCEPT if all of the points are from question 1   No results found for any visits on 09/08/20.  Assessment & Plan     Annual wellness visit done today including the all of the following: Reviewed patient's Family Medical History Reviewed and updated list of patient's medical providers Assessment of cognitive impairment was done Assessed patient's functional ability Established a written schedule for health screening services Health Risk Assessent Completed and Reviewed  Exercise Activities and Dietary recommendations Goals   None     Immunization History  Administered Date(s) Administered  . Influenza,inj,Quad PF,6+ Mos 08/30/2016, 08/01/2017, 07/18/2018  . Td 07/18/2018  . Tdap 07/24/2008  . Zoster Recombinat (Shingrix) 03/14/2019, 05/28/2019    Health Maintenance   Topic Date Due  . Hepatitis C Screening  Never done  . COVID-19 Vaccine (1) Never done  . HIV Screening  Never done  . PNA vac Low Risk Adult (1 of 2 - PCV13) Never done  . INFLUENZA VACCINE  06/08/2020  . COLONOSCOPY  02/18/2025  . TETANUS/TDAP  07/18/2028     Discussed health benefits  of physical activity, and encouraged him to engage in regular exercise appropriate for his age and condition.    1. Welcome to Medicare preventive visit  - EKG 12-Lead  2. Colon cancer screening  - IFOBT POC (occult bld, rslt in office); Future - IFOBT POC (occult bld, rslt in office)  3. Influenza vaccine needed  - Flu Vaccine QUAD High Dose(Fluad)  4. Need for pneumococcal vaccination  - Pneumococcal polysaccharide vaccine 23-valent greater than or equal to 2yo subcutaneous/IM   No follow-ups on file.        Glennis Borger Wendelyn Breslow, MD  [facility] [phone] (phone) [phone] (fax)  [facility]

## 2020-11-03 ENCOUNTER — Other Ambulatory Visit: Payer: Self-pay | Admitting: Family Medicine

## 2020-11-03 DIAGNOSIS — I10 Essential (primary) hypertension: Secondary | ICD-10-CM

## 2021-03-23 ENCOUNTER — Other Ambulatory Visit: Payer: Self-pay | Admitting: Family Medicine

## 2021-03-23 DIAGNOSIS — I10 Essential (primary) hypertension: Secondary | ICD-10-CM

## 2021-04-23 ENCOUNTER — Other Ambulatory Visit: Payer: Self-pay | Admitting: Family Medicine

## 2021-04-23 DIAGNOSIS — I10 Essential (primary) hypertension: Secondary | ICD-10-CM

## 2021-07-14 ENCOUNTER — Other Ambulatory Visit: Payer: Self-pay | Admitting: Family Medicine

## 2021-07-14 DIAGNOSIS — I10 Essential (primary) hypertension: Secondary | ICD-10-CM

## 2021-08-11 NOTE — Progress Notes (Signed)
      I,April Miller,acting as a scribe for Megan Mans, MD.,have documented all relevant documentation on the behalf of Megan Mans, MD,as directed by  Megan Mans, MD while in the presence of Megan Mans, MD.  Established patient visit   Patient: Travis Brown   DOB: [birth year]   65 y.o. Male  MRN: [id number] Visit Date: 08/12/2021  Today's healthcare provider: Megan Mans, MD   Chief Complaint  Patient presents with   Skin Tag   Subjective    HPI  Patient is here for skin removal. Patient has a spot on his upper back that he wants provider to check. Irritated large skin tag in upper right back and along left anterior waist band.    Medications: Outpatient Medications Prior to Visit  Medication Sig   amLODipine (NORVASC) 10 MG tablet TAKE 1 TABLET BY MOUTH  DAILY   lisinopril (ZESTRIL) 40 MG tablet TAKE 1 TABLET BY MOUTH  DAILY   No facility-administered medications prior to visit.    Review of Systems  Constitutional:  Negative for appetite change, chills and fever.  Respiratory:  Negative for chest tightness, shortness of breath and wheezing.   Cardiovascular:  Negative for chest pain and palpitations.  Gastrointestinal:  Negative for abdominal pain, nausea and vomiting.       Objective    BP (!) 158/89 (BP Location: Left Arm, Patient Position: Sitting, Cuff Size: Large)   Pulse 67   Resp 16   Ht 5\' 10"  (1.778 m)   Wt 218 lb (98.9 kg)   SpO2 97%   BMI 31.28 kg/m  BP Readings from Last 3 Encounters:  08/12/21 (!) 158/89  09/08/20 124/76  09/05/19 134/76   Wt Readings from Last 3 Encounters:  08/12/21 218 lb (98.9 kg)  09/08/20 223 lb (101.2 kg)  09/05/19 222 lb (100.7 kg)      Physical Exam  Large irritated skin tag on right upper back just below where the collar the neck/shirt is another irritated large skin tag in the left lower abdomen just below the waistline of the pant  No results found for any visits on  08/12/21.  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  2. Skin tags, multiple acquired/very irritated 2 areas of above skin tags prepped with Betadine and anesthesia with lidocaine with epi.  Tags were removed at the base and hemostasis is with direct pressure.  Patient tolerated procedure well.  3. Hip pain, acute, right Appears to be musculoskeletal at this time.  4. Need for influenza vaccination  - Flu Vaccine QUAD High Dose(Fluad)   Return in about 1 month (around 09/12/2021).      I, 13/03/2021, MD, have reviewed all documentation for this visit. The documentation on 08/15/21 for the exam, diagnosis, procedures, and orders are all accurate and complete.    Jaheim Canino 10/15/21, MD  [facility] [phone] (phone) [phone] (fax)  [facility]

## 2021-08-12 ENCOUNTER — Encounter: Payer: Self-pay | Admitting: Family Medicine

## 2021-08-12 ENCOUNTER — Other Ambulatory Visit: Payer: Self-pay

## 2021-08-12 ENCOUNTER — Ambulatory Visit: Payer: Managed Care, Other (non HMO) | Admitting: Family Medicine

## 2021-08-12 VITALS — BP 158/89 | HR 67 | Resp 16 | Ht 70.0 in | Wt 218.0 lb

## 2021-08-12 DIAGNOSIS — N5203 Combined arterial insufficiency and corporo-venous occlusive erectile dysfunction: Secondary | ICD-10-CM | POA: Diagnosis not present

## 2021-08-12 DIAGNOSIS — Z23 Encounter for immunization: Secondary | ICD-10-CM | POA: Diagnosis not present

## 2021-08-12 DIAGNOSIS — M25551 Pain in right hip: Secondary | ICD-10-CM | POA: Diagnosis not present

## 2021-08-12 DIAGNOSIS — L918 Other hypertrophic disorders of the skin: Secondary | ICD-10-CM

## 2021-08-12 MED ORDER — TADALAFIL 20 MG PO TABS: 20.0000 mg | ORAL_TABLET | ORAL | 5 refills | Status: AC | PRN

## 2021-09-10 ENCOUNTER — Encounter: Payer: Self-pay | Admitting: Family Medicine

## 2021-10-07 ENCOUNTER — Other Ambulatory Visit: Payer: Self-pay | Admitting: Family Medicine

## 2021-10-07 DIAGNOSIS — I10 Essential (primary) hypertension: Secondary | ICD-10-CM

## 2021-10-09 NOTE — Telephone Encounter (Signed)
Requested medication (s) are due for refill today:  Yes for both  Requested medication (s) are on the active medication list:   Yes for both  Future visit scheduled:   No Seen a month ago   Last ordered: Amlodipine 07/15/2021 #90, 0 refills;    Lisinopril 11/04/2020 #90, 3 refills  Returned because protocol failed due to lab work being due.    Pt requesting a 1 yr supply too.   Requested Prescriptions  Pending Prescriptions Disp Refills   amLODipine (NORVASC) 10 MG tablet [Pharmacy Med Name: amLODIPine Besylate 10 MG Oral Tablet] 90 tablet 3    Sig: TAKE 1 TABLET BY MOUTH  DAILY     Cardiovascular:  Calcium Channel Blockers Failed - 10/07/2021 10:37 PM      Failed - Last BP in normal range    BP Readings from Last 1 Encounters:  08/12/21 (!) 158/89          Passed - Valid encounter within last 6 months    Recent Outpatient Visits           1 month ago Combined arterial insufficiency and corporo-venous occlusive erectile dysfunction   [facility] Maple Hudson., MD   1 year ago Welcome to [facility] preventive visit   [facility] Maple Hudson., MD   2 years ago Annual physical exam   [facility] Maple Hudson., MD   2 years ago Need for shingles vaccine   [facility] Maple Hudson., MD   2 years ago Need for shingles vaccine   [facility] Maple Hudson., MD               lisinopril (ZESTRIL) 40 MG tablet [Pharmacy Med Name: Lisinopril 40 MG Oral Tablet] 90 tablet 3    Sig: TAKE 1 TABLET BY MOUTH  DAILY     Cardiovascular:  ACE Inhibitors Failed - 10/07/2021 10:37 PM      Failed - Cr in normal range and within 180 days    Creatinine, Ser  Date Value Ref Range Status  09/19/2019 1.02 0.76 - 1.27 mg/dL Final          Failed - K in normal range and within 180 days    Potassium  Date Value Ref Range Status  09/19/2019 4.5 3.5 - 5.2 mmol/L Final           Failed - Last BP in normal range    BP Readings from Last 1 Encounters:  08/12/21 (!) 158/89          Passed - Patient is not pregnant      Passed - Valid encounter within last 6 months    Recent Outpatient Visits           1 month ago Combined arterial insufficiency and corporo-venous occlusive erectile dysfunction   [facility] Maple Hudson., MD   1 year ago Welcome to [facility] preventive visit   [facility] Maple Hudson., MD   2 years ago Annual physical exam   [facility] Maple Hudson., MD   2 years ago Need for shingles vaccine   [facility] Maple Hudson., MD   2 years ago Need for shingles vaccine   [facility] Maple Hudson., MD

## 2021-11-20 ENCOUNTER — Other Ambulatory Visit: Payer: Self-pay | Admitting: Family Medicine

## 2021-11-20 DIAGNOSIS — I10 Essential (primary) hypertension: Secondary | ICD-10-CM

## 2021-11-20 NOTE — Telephone Encounter (Signed)
Requested medication (s) are due for refill today: yes  Requested medication (s) are on the active medication list: yes  Last refill:  10/09/21  Future visit scheduled: no, seen 08/12/21 to return in one month, canceled that appt 09/10/21, given curtesy refill 10/09/21, no upcoming visit scheduled  Notes to clinic:  Failed protocol of labs within 180 days, (labs 09/19/2019) already given curtesy refill, no upcoming appt, please assess.   Requested Prescriptions  Pending Prescriptions Disp Refills   lisinopril (ZESTRIL) 40 MG tablet [Pharmacy Med Name: Lisinopril 40 MG Oral Tablet] 30 tablet 11    Sig: TAKE 1 TABLET BY MOUTH  DAILY     Cardiovascular:  ACE Inhibitors Failed - 11/20/2021 10:34 PM      Failed - Cr in normal range and within 180 days    Creatinine, Ser  Date Value Ref Range Status  09/19/2019 1.02 0.76 - 1.27 mg/dL Final          Failed - K in normal range and within 180 days    Potassium  Date Value Ref Range Status  09/19/2019 4.5 3.5 - 5.2 mmol/L Final          Failed - Last BP in normal range    BP Readings from Last 1 Encounters:  08/12/21 (!) 158/89          Passed - Patient is not pregnant      Passed - Valid encounter within last 6 months    Recent Outpatient Visits           3 months ago Combined arterial insufficiency and corporo-venous occlusive erectile dysfunction   [facility] Jerrol Banana., MD   1 year ago Welcome to [facility] preventive visit   [facility] Rosanna Randy, Retia Passe., MD   2 years ago Annual physical exam   [facility] Jerrol Banana., MD   2 years ago Need for shingles vaccine   [facility] Jerrol Banana., MD   2 years ago Need for shingles vaccine   [facility] Jerrol Banana., MD               amLODipine (NORVASC) 10 MG tablet [Pharmacy Med Name: amLODIPine Besylate 10 MG Oral Tablet] 30 tablet 11    Sig: TAKE 1  TABLET BY MOUTH DAILY     Cardiovascular:  Calcium Channel Blockers Failed - 11/20/2021 10:34 PM      Failed - Last BP in normal range    BP Readings from Last 1 Encounters:  08/12/21 (!) 158/89          Passed - Valid encounter within last 6 months    Recent Outpatient Visits           3 months ago Combined arterial insufficiency and corporo-venous occlusive erectile dysfunction   [facility] Jerrol Banana., MD   1 year ago Welcome to [facility] preventive visit   [facility] Jerrol Banana., MD   2 years ago Annual physical exam   [facility] Jerrol Banana., MD   2 years ago Need for shingles vaccine   [facility] Jerrol Banana., MD   2 years ago Need for shingles vaccine   [facility] Jerrol Banana., MD

## 2021-11-23 NOTE — Telephone Encounter (Signed)
Patient is due for follow up for blood pressure and labs. Need to schedule appt with Lillia Abed or Robynn Pane, or Dr. Linwood Dibbles within the next month.

## 2022-07-22 DIAGNOSIS — H524 Presbyopia: Secondary | ICD-10-CM | POA: Diagnosis not present

## 2022-07-22 DIAGNOSIS — H52223 Regular astigmatism, bilateral: Secondary | ICD-10-CM | POA: Diagnosis not present

## 2022-07-22 DIAGNOSIS — H5203 Hypermetropia, bilateral: Secondary | ICD-10-CM | POA: Diagnosis not present

## 2022-07-22 DIAGNOSIS — H35363 Drusen (degenerative) of macula, bilateral: Secondary | ICD-10-CM | POA: Diagnosis not present

## 2022-07-22 DIAGNOSIS — H2513 Age-related nuclear cataract, bilateral: Secondary | ICD-10-CM | POA: Diagnosis not present

## 2022-07-22 DIAGNOSIS — H35033 Hypertensive retinopathy, bilateral: Secondary | ICD-10-CM | POA: Diagnosis not present

## 2022-09-08 ENCOUNTER — Encounter: Payer: Managed Care, Other (non HMO) | Admitting: Family Medicine

## 2022-09-08 NOTE — Progress Notes (Deleted)
I,Waynetta Metheny E Darrold Bezek,acting as a scribe for [employer], MD.,have documented all relevant documentation on the behalf of Ronnald Ramp, MD,as directed by  Ronnald Ramp, MD while in the presence of Ronnald Ramp, MD.    Annual Wellness Visit     Patient: Travis Brown, Male    DOB: [birth year], 66 y.o.   MRN: [id number] Visit Date: 09/08/2022  Today's Provider: Ronnald Ramp, MD   No chief complaint on file.  Subjective    Travis Brown is a 66 y.o. male who presents today for his Annual Wellness Visit. He reports consuming a {diet types:17450} diet. {Exercise:19826} He generally feels {well/fairly well/poorly:18703}. He reports sleeping {well/fairly well/poorly:18703}. He {does/does not:200015} have additional problems to discuss today.   HPI   Medications: Outpatient Medications Prior to Visit  Medication Sig   amLODipine (NORVASC) 10 MG tablet Take 1 tablet (10 mg total) by mouth daily. Please schedule office visit prior for further refills.   lisinopril (ZESTRIL) 40 MG tablet TAKE 1 TABLET BY MOUTH  DAILY   tadalafil (CIALIS) 20 MG tablet Take 1 tablet (20 mg total) by mouth every three (3) days as needed for erectile dysfunction.   No facility-administered medications prior to visit.    No Known Allergies  Patient Care Team: Maple Hudson., MD as PCP - General (Family Medicine)  Review of Systems  {Labs  Heme  Chem  Endocrine  Serology  Results Review (optional):23779}    Objective    Vitals: There were no vitals taken for this visit. {Show previous vital signs (optional):23777}   Physical Exam ***  Most recent functional status assessment:     No data to display         Most recent fall risk assessment:    09/08/2020    3:34 PM  Fall Risk   Falls in the past year? 0  Number falls in past yr: 0  Injury with Fall? 0  Risk for fall due to : No Fall Risks  Follow up Falls  evaluation completed    Most recent depression screenings:    09/08/2020    3:34 PM 09/05/2019    3:40 PM  PHQ 2/9 Scores  PHQ - 2 Score 0 3  PHQ- 9 Score  5   Most recent cognitive screening:    09/08/2020    3:33 PM  6CIT Screen  What Year? 0 points  What month? 0 points  What time? 0 points  Count back from 20 0 points  Months in reverse 0 points  Repeat phrase 0 points  Total Score 0 points   Most recent Audit-C alcohol use screening    09/08/2020    3:34 PM  Alcohol Use Disorder Test (AUDIT)  1. How often do you have a drink containing alcohol? 3  2. How many drinks containing alcohol do you have on a typical day when you are drinking? 0  3. How often do you have six or more drinks on one occasion? 0  AUDIT-C Score 3  4. How often during the last year have you found that you were not able to stop drinking once you had started? 0  5. How often during the last year have you failed to do what was normally expected from you because of drinking? 0  6. How often during the last year have you needed a first drink in the morning to get yourself going after a heavy drinking session? 0  7. How often during the last  year have you had a feeling of guilt of remorse after drinking? 0  8. How often during the last year have you been unable to remember what happened the night before because you had been drinking? 0  9. Have you or someone else been injured as a result of your drinking? 0  10. Has a relative or friend or a doctor or another health worker been concerned about your drinking or suggested you cut down? 0  Alcohol Use Disorder Identification Test Final Score (AUDIT) 3  Alcohol Brief Interventions/Follow-up AUDIT Score <7 follow-up not indicated   A score of 3 or more in women, and 4 or more in men indicates increased risk for alcohol abuse, EXCEPT if all of the points are from question 1   No results found for any visits on 09/08/22.  Assessment & Plan     Annual  wellness visit done today including the all of the following: Reviewed patient's Family Medical History Reviewed and updated list of patient's medical providers Assessment of cognitive impairment was done Assessed patient's functional ability Established a written schedule for health screening [place] Completed and Reviewed  Exercise Activities and Dietary recommendations  Goals   None     Immunization History  Administered Date(s) Administered   Fluad Quad(high Dose 65+) 09/08/2020, 08/12/2021   Influenza,inj,Quad PF,6+ Mos 08/30/2016, 08/01/2017, 07/18/2018, 08/23/2019   Pneumococcal Polysaccharide-23 09/08/2020   Td 07/18/2018   Tdap 07/24/2008   Zoster Recombinat (Shingrix) 03/14/2019, 05/28/2019    Health Maintenance  Topic Date Due   COVID-19 Vaccine (1) Never done   Hepatitis C Screening  Never done   Pneumonia Vaccine 7+ Years old (2 - PCV) 09/08/2021   Medicare Annual Wellness (AWV)  09/08/2021   INFLUENZA VACCINE  06/08/2022   COLONOSCOPY (Pts 45-72yrs Insurance coverage will need to be confirmed)  02/18/2025   TETANUS/TDAP  07/18/2028   Zoster Vaccines- Shingrix  Completed   HPV VACCINES  Aged Out     Discussed health benefits of physical activity, and encouraged him to engage in regular exercise appropriate for his age and condition.    ***  No follow-ups on file.     {provider attestation***:1}   Eulis Foster, MD  [facility] [phone] (phone) [phone] (fax)  [place]

## 2022-09-16 ENCOUNTER — Ambulatory Visit: Payer: Managed Care, Other (non HMO) | Admitting: Family Medicine

## 2022-11-12 DIAGNOSIS — N529 Male erectile dysfunction, unspecified: Secondary | ICD-10-CM | POA: Diagnosis not present

## 2022-11-12 DIAGNOSIS — Z6832 Body mass index (BMI) 32.0-32.9, adult: Secondary | ICD-10-CM | POA: Diagnosis not present

## 2022-11-12 DIAGNOSIS — I10 Essential (primary) hypertension: Secondary | ICD-10-CM | POA: Diagnosis not present

## 2022-12-22 DIAGNOSIS — N529 Male erectile dysfunction, unspecified: Secondary | ICD-10-CM | POA: Diagnosis not present

## 2022-12-22 DIAGNOSIS — Z0000 Encounter for general adult medical examination without abnormal findings: Secondary | ICD-10-CM | POA: Diagnosis not present

## 2022-12-22 DIAGNOSIS — I10 Essential (primary) hypertension: Secondary | ICD-10-CM | POA: Diagnosis not present

## 2022-12-22 DIAGNOSIS — Z125 Encounter for screening for malignant neoplasm of prostate: Secondary | ICD-10-CM | POA: Diagnosis not present

## 2022-12-22 DIAGNOSIS — Z7185 Encounter for immunization safety counseling: Secondary | ICD-10-CM | POA: Diagnosis not present

## 2022-12-22 DIAGNOSIS — Z136 Encounter for screening for cardiovascular disorders: Secondary | ICD-10-CM | POA: Diagnosis not present

## 2022-12-22 DIAGNOSIS — Z6832 Body mass index (BMI) 32.0-32.9, adult: Secondary | ICD-10-CM | POA: Diagnosis not present

## 2022-12-22 DIAGNOSIS — Z1211 Encounter for screening for malignant neoplasm of colon: Secondary | ICD-10-CM | POA: Diagnosis not present

## 2023-02-11 DIAGNOSIS — N529 Male erectile dysfunction, unspecified: Secondary | ICD-10-CM | POA: Diagnosis not present

## 2023-02-11 DIAGNOSIS — I10 Essential (primary) hypertension: Secondary | ICD-10-CM | POA: Diagnosis not present

## 2023-02-11 DIAGNOSIS — E785 Hyperlipidemia, unspecified: Secondary | ICD-10-CM | POA: Diagnosis not present

## 2023-07-13 DIAGNOSIS — R0683 Snoring: Secondary | ICD-10-CM | POA: Diagnosis not present

## 2023-07-13 DIAGNOSIS — Z23 Encounter for immunization: Secondary | ICD-10-CM | POA: Diagnosis not present

## 2023-07-13 DIAGNOSIS — I10 Essential (primary) hypertension: Secondary | ICD-10-CM | POA: Diagnosis not present

## 2023-07-13 DIAGNOSIS — E785 Hyperlipidemia, unspecified: Secondary | ICD-10-CM | POA: Diagnosis not present

## 2023-07-25 DIAGNOSIS — H524 Presbyopia: Secondary | ICD-10-CM | POA: Diagnosis not present

## 2023-08-03 DIAGNOSIS — R0683 Snoring: Secondary | ICD-10-CM | POA: Diagnosis not present

## 2023-08-03 DIAGNOSIS — G4710 Hypersomnia, unspecified: Secondary | ICD-10-CM | POA: Diagnosis not present

## 2024-02-03 DIAGNOSIS — I10 Essential (primary) hypertension: Secondary | ICD-10-CM | POA: Diagnosis not present

## 2024-02-03 DIAGNOSIS — Z0000 Encounter for general adult medical examination without abnormal findings: Secondary | ICD-10-CM | POA: Diagnosis not present

## 2024-02-03 DIAGNOSIS — E785 Hyperlipidemia, unspecified: Secondary | ICD-10-CM | POA: Diagnosis not present

## 2024-02-11 ENCOUNTER — Other Ambulatory Visit (HOSPITAL_BASED_OUTPATIENT_CLINIC_OR_DEPARTMENT_OTHER): Payer: Self-pay | Admitting: Family Medicine

## 2024-02-11 DIAGNOSIS — E785 Hyperlipidemia, unspecified: Secondary | ICD-10-CM

## 2024-03-23 ENCOUNTER — Ambulatory Visit (HOSPITAL_BASED_OUTPATIENT_CLINIC_OR_DEPARTMENT_OTHER)
Admission: RE | Admit: 2024-03-23 | Discharge: 2024-03-23 | Disposition: A | Discharge status: Home / Self Care | Payer: Self-pay | Source: Ambulatory Visit | Attending: Family Medicine | Admitting: Family Medicine

## 2024-03-23 DIAGNOSIS — E785 Hyperlipidemia, unspecified: Secondary | ICD-10-CM | POA: Insufficient documentation
